# Patient Record
Sex: Female | Born: 2000 | Race: White | Hispanic: No | Smoking: Former smoker
Health system: Southern US, Community
[De-identification: ages and names within clinical notes are randomized; demographics above are authoritative.]

## PROBLEM LIST (undated history)

## (undated) DIAGNOSIS — K219 Gastro-esophageal reflux disease without esophagitis: Secondary | ICD-10-CM

## (undated) DIAGNOSIS — F32A Depression, unspecified: Secondary | ICD-10-CM

## (undated) DIAGNOSIS — F419 Anxiety disorder, unspecified: Secondary | ICD-10-CM

## (undated) HISTORY — PX: APPENDECTOMY: SHX54

## (undated) HISTORY — DX: Depression, unspecified: F32.A

## (undated) HISTORY — DX: Anxiety disorder, unspecified: F41.9

## (undated) HISTORY — DX: Gastro-esophageal reflux disease without esophagitis: K21.9

---
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
## 2018-01-16 ENCOUNTER — Other Ambulatory Visit: Payer: Self-pay | Admitting: Physician Assistant

## 2018-01-16 DIAGNOSIS — F39 Unspecified mood [affective] disorder: Secondary | ICD-10-CM

## 2018-01-16 DIAGNOSIS — F411 Generalized anxiety disorder: Secondary | ICD-10-CM

## 2018-01-16 DIAGNOSIS — J45909 Unspecified asthma, uncomplicated: Secondary | ICD-10-CM

## 2018-03-17 ENCOUNTER — Other Ambulatory Visit: Payer: Self-pay | Admitting: Family Medicine

## 2018-03-17 DIAGNOSIS — J101 Influenza due to other identified influenza virus with other respiratory manifestations: Secondary | ICD-10-CM

## 2018-12-22 ENCOUNTER — Telehealth: Payer: Self-pay | Admitting: Family

## 2018-12-22 NOTE — Telephone Encounter (Signed)
Pt is aware that she will have to come in and sign a medical records release form and the company [employer] comes in once a week to process, once processed pt will receive a statement once paid they will release records. Pt verbalized understanding states will come by and sign a release form.

## 2019-04-07 DIAGNOSIS — Z3689 Encounter for other specified antenatal screening: Secondary | ICD-10-CM | POA: Diagnosis not present

## 2019-05-07 DIAGNOSIS — Z3A22 22 weeks gestation of pregnancy: Secondary | ICD-10-CM | POA: Diagnosis not present

## 2019-05-07 DIAGNOSIS — Z3689 Encounter for other specified antenatal screening: Secondary | ICD-10-CM | POA: Diagnosis not present

## 2019-05-07 DIAGNOSIS — Z3492 Encounter for supervision of normal pregnancy, unspecified, second trimester: Secondary | ICD-10-CM | POA: Diagnosis not present

## 2019-05-07 DIAGNOSIS — N898 Other specified noninflammatory disorders of vagina: Secondary | ICD-10-CM | POA: Diagnosis not present

## 2019-05-07 DIAGNOSIS — O26892 Other specified pregnancy related conditions, second trimester: Secondary | ICD-10-CM | POA: Diagnosis not present

## 2019-06-01 ENCOUNTER — Ambulatory Visit (INDEPENDENT_AMBULATORY_CARE_PROVIDER_SITE_OTHER): Payer: Medicaid Other | Admitting: Otolaryngology

## 2019-06-01 ENCOUNTER — Other Ambulatory Visit: Payer: Self-pay

## 2019-06-01 DIAGNOSIS — H93293 Other abnormal auditory perceptions, bilateral: Secondary | ICD-10-CM

## 2019-06-02 ENCOUNTER — Ambulatory Visit (INDEPENDENT_AMBULATORY_CARE_PROVIDER_SITE_OTHER): Payer: Medicaid Other | Admitting: Family Medicine

## 2019-06-02 ENCOUNTER — Encounter: Payer: Self-pay | Admitting: Family Medicine

## 2019-06-02 DIAGNOSIS — R6889 Other general symptoms and signs: Secondary | ICD-10-CM

## 2019-06-02 DIAGNOSIS — Z20822 Contact with and (suspected) exposure to covid-19: Secondary | ICD-10-CM

## 2019-06-02 DIAGNOSIS — J069 Acute upper respiratory infection, unspecified: Secondary | ICD-10-CM | POA: Diagnosis not present

## 2019-06-02 NOTE — Patient Instructions (Signed)
Safe Medications in Pregnancy    Acne: Benzoyl Peroxide Salicylic Acid   Backache/Headache: Tylenol: 2 regular strength every 4 hours OR              2 Extra strength every 6 hours   Colds/Coughs/Allergies: Benadryl (alcohol free) 25 mg every 6 hours as needed Breath right strips Claritin Cepacol throat lozenges Chloraseptic throat spray Cold-Eeze- up to three times per day Cough drops, alcohol free Flonase (by prescription only) Guaifenesin Mucinex Robitussin DM (plain only, alcohol free) Saline nasal spray/drops Sudafed (pseudoephedrine) & Actifed ** use only after [redacted] weeks gestation and if you do not have high blood pressure Tylenol Vicks Vaporub Zinc lozenges Zyrtec    Constipation: Colace Ducolax suppositories Fleet enema Glycerin suppositories Metamucil Milk of magnesia Miralax Senokot Smooth move tea   Diarrhea: Kaopectate Imodium A-D   *NO pepto Bismol   Hemorrhoids: Anusol Anusol HC Preparation H Tucks   Indigestion: Tums Maalox Mylanta Zantac  Pepcid Nexium 24HR   Insomnia: Benadryl (alcohol free) 25mg every 6 hours as needed Tylenol PM Unisom, no Gelcaps   Leg Cramps: Tums MagGel   Nausea/Vomiting:  Bonine Dramamine Emetrol Ginger extract Sea bands Meclizine  Nausea medication to take during pregnancy:  Unisom (doxylamine succinate 25 mg tablets) Take one tablet daily at bedtime. If symptoms are not adequately controlled, the dose can be increased to a maximum recommended dose of two tablets daily (1/2 tablet in the morning, 1/2 tablet mid-afternoon and one at bedtime). Vitamin B6 100mg tablets. Take one tablet twice a day (up to 200 mg per day).   Skin Rashes: Aveeno products Benadryl cream or 25mg every 6 hours as needed Calamine Lotion 1% cortisone cream   Yeast infection: Gyne-lotrimin 7 Monistat 7     **If taking multiple medications, please check labels to avoid duplicating the same active ingredients **take  medication as directed on the label ** Do not exceed 4000 mg of tylenol in 24 hours **Do not take medications that contain aspirin or ibuprofen 

## 2019-06-03 DIAGNOSIS — Z20828 Contact with and (suspected) exposure to other viral communicable diseases: Secondary | ICD-10-CM | POA: Diagnosis not present

## 2019-06-03 DIAGNOSIS — J029 Acute pharyngitis, unspecified: Secondary | ICD-10-CM | POA: Diagnosis not present

## 2019-06-03 NOTE — Progress Notes (Signed)
Virtual Visit via telephone Note Due to COVID-19 pandemic this visit was conducted virtually. This visit type was conducted due to national recommendations for restrictions regarding the COVID-19 Pandemic (e.g. social distancing, sheltering in place) in an effort to limit this patient's exposure and mitigate transmission in our community. All issues noted in this document were discussed and addressed.  A physical exam was not performed with this format.   I connected with Anniebelle A Coster on 06/03/19 at 1610 by telephone and verified that I am speaking with the correct person using two identifiers. Kailen A Gryder is currently located at home and family is currently with them during visit. The provider, Monia Pouch, FNP is located in their office at time of visit.  I discussed the limitations, risks, security and privacy concerns of performing an evaluation and management service by telephone and the availability of in person appointments. I also discussed with the patient that there may be a patient responsible charge related to this service. The patient expressed understanding and agreed to proceed.  Subjective:  Patient ID: Carolin Guernsey, female    DOB: November 10, 2000, 18 y.o.   MRN: [id number]  Chief Complaint:  URI   HPI: Latreece A Castleman is a 18 y.o. female presenting on 06/02/2019 for URI   Pt reports 2-3 days of cold chills, cough, congestion, myalgias, ear pressure, and sore throat. Pt denies measured fever. States she has been out to the store. No known sick contacts. No recent travel. She has not tried anything or her symptoms.   URI  This is a new problem. The current episode started in the past 7 days. The problem has been unchanged. There has been no fever. Associated symptoms include congestion, coughing, ear pain, a plugged ear sensation, rhinorrhea and a sore throat. Pertinent negatives include no abdominal pain, chest pain, diarrhea, dysuria, headaches, joint pain, joint swelling,  nausea, neck pain, rash, sinus pain, sneezing, swollen glands, vomiting or wheezing. She has tried nothing for the symptoms.     Relevant past medical, surgical, family, and social history reviewed and updated as indicated.  Allergies and medications reviewed and updated.   Past Medical History:  Diagnosis Date  . GERD (gastroesophageal reflux disease)     Past Surgical History:  Procedure Laterality Date  . APPENDECTOMY      Social History   Socioeconomic History  . Marital status: Single    Spouse name: Not on file  . Number of children: Not on file  . Years of education: Not on file  . Highest education level: Not on file  Occupational History  . Not on file  Social Needs  . Financial resource strain: Not on file  . Food insecurity    Worry: Not on file    Inability: Not on file  . Transportation needs    Medical: Not on file    Non-medical: Not on file  Tobacco Use  . Smoking status: Never Smoker  . Smokeless tobacco: Never Used  Substance and Sexual Activity  . Alcohol use: No  . Drug use: No  . Sexual activity: Yes  Lifestyle  . Physical activity    Days per week: Not on file    Minutes per session: Not on file  . Stress: Not on file  Relationships  . Social Herbalist on phone: Not on file    Gets together: Not on file    Attends religious service: Not on file    Active member  of club or organization: Not on file    Attends meetings of clubs or organizations: Not on file    Relationship status: Not on file  . Intimate partner violence    Fear of current or ex partner: Not on file    Emotionally abused: Not on file    Physically abused: Not on file    Forced sexual activity: Not on file  Other Topics Concern  . Not on file  Social History Narrative  . Not on file    Outpatient Encounter Medications as of 06/02/2019  Medication Sig  . albuterol (PROAIR HFA) 108 (90 Base) MCG/ACT inhaler USE 2 PUFFS EVERY 6 HOURS AS NEEDED  .  fluticasone (FLONASE) 50 MCG/ACT nasal spray Place 2 sprays into both nostrils daily.  Marland Kitchen. levonorgestrel-ethinyl estradiol (ENPRESSE,TRIVORA) tablet Take 1 tablet by mouth daily.  Marland Kitchen. loratadine (CLARITIN) 10 MG tablet Take 1 tablet (10 mg total) by mouth daily.  Marland Kitchen. omeprazole (PRILOSEC) 20 MG capsule Take 1 capsule (20 mg total) by mouth daily.  Marland Kitchen. oseltamivir (TAMIFLU) 75 MG capsule Take 1 capsule (75 mg total) by mouth 2 (two) times daily.  Marland Kitchen. oxybutynin (DITROPAN-XL) 10 MG 24 hr tablet Take 1 tablet (10 mg total) by mouth at bedtime.  . risperiDONE (RISPERDAL) 2 MG tablet TAKE ONE (1) TABLET EACH DAY (Patient not taking: Reported on 12/30/2017)  . sertraline (ZOLOFT) 100 MG tablet Take 2 tablets (200 mg total) by mouth daily.  . sertraline (ZOLOFT) 100 MG tablet TAKE TWO TABLETS BY MOUTH DAILY  . sulfamethoxazole-trimethoprim (BACTRIM DS) 800-160 MG tablet Take 1 tablet by mouth 2 (two) times daily.  . SUMAtriptan (IMITREX) 50 MG tablet Take 1 tablet (50 mg total) by mouth every 2 (two) hours as needed for migraine. May repeat in 2 hours if headache persists or recurs.   No facility-administered encounter medications on file as of 06/02/2019.     Allergies  Allergen Reactions  . Penicillins     Review of Systems  Constitutional: Positive for chills and fatigue. Negative for activity change, appetite change, diaphoresis, fever and unexpected weight change.  HENT: Positive for congestion, ear pain, postnasal drip, rhinorrhea and sore throat. Negative for dental problem, drooling, ear discharge, facial swelling, hearing loss, mouth sores, nosebleeds, sinus pressure, sinus pain, sneezing, tinnitus, trouble swallowing and voice change.   Respiratory: Positive for cough. Negative for shortness of breath and wheezing.   Cardiovascular: Negative for chest pain and palpitations.  Gastrointestinal: Negative for abdominal pain, diarrhea, nausea and vomiting.  Genitourinary: Negative for dysuria.   Musculoskeletal: Negative for arthralgias, joint pain and neck pain.  Skin: Negative for rash.  Neurological: Negative for dizziness, weakness, light-headedness and headaches.  Psychiatric/Behavioral: Negative for confusion.  All other systems reviewed and are negative.        Observations/Objective: No vital signs or physical exam, this was a telephone or virtual health encounter.  Pt alert and oriented, answers all questions appropriately, and able to speak in full sentences.    Assessment and Plan: Laurabeth was seen today for uri.  Diagnoses and all orders for this visit:  URI with cough and congestion Suspected 2019 Novel Coronavirus Infection Reported signs and symptoms concerning for COVID-19 infection. Testing ordered and pt aware of testing sites and times of operation. Symptomatic care discussed. Self quarantine and infection prevention measures discussed with pt. Pt aware to remain in quarantine for at least 72 hours after symptoms free without medications. Pt aware to report any new or worsening symptoms.  -  Novel Coronavirus, NAA ([facility]); Future     Follow Up Instructions: Return if symptoms worsen or fail to improve.    I discussed the assessment and treatment plan with the patient. The patient was provided an opportunity to ask questions and all were answered. The patient agreed with the plan and demonstrated an understanding of the instructions.   The patient was advised to call back or seek an in-person evaluation if the symptoms worsen or if the condition fails to improve as anticipated.  The above assessment and management plan was discussed with the patient. The patient verbalized understanding of and has agreed to the management plan. Patient is aware to call the clinic if symptoms persist or worsen. Patient is aware when to return to the clinic for a follow-up visit. Patient educated on when it is appropriate to go to the emergency department.    I  provided 15 minutes of non-face-to-face time during this encounter. The call started at 1610. The call ended at 1625. The other time was used for coordination of care.    Kari BaarsMichelle Rakes, FNP-C [facility] [address] [place], [place] [address] [phone]

## 2019-06-11 DIAGNOSIS — O9989 Other specified diseases and conditions complicating pregnancy, childbirth and the puerperium: Secondary | ICD-10-CM | POA: Diagnosis not present

## 2019-06-11 DIAGNOSIS — Z3689 Encounter for other specified antenatal screening: Secondary | ICD-10-CM | POA: Diagnosis not present

## 2019-06-11 DIAGNOSIS — M549 Dorsalgia, unspecified: Secondary | ICD-10-CM | POA: Diagnosis not present

## 2019-06-25 DIAGNOSIS — Z3689 Encounter for other specified antenatal screening: Secondary | ICD-10-CM | POA: Diagnosis not present

## 2019-07-20 DIAGNOSIS — Z3689 Encounter for other specified antenatal screening: Secondary | ICD-10-CM | POA: Diagnosis not present

## 2019-07-23 DIAGNOSIS — Z880 Allergy status to penicillin: Secondary | ICD-10-CM | POA: Diagnosis not present

## 2019-07-23 DIAGNOSIS — R58 Hemorrhage, not elsewhere classified: Secondary | ICD-10-CM | POA: Diagnosis not present

## 2019-07-23 DIAGNOSIS — O42919 Preterm premature rupture of membranes, unspecified as to length of time between rupture and onset of labor, unspecified trimester: Secondary | ICD-10-CM

## 2019-08-14 DIAGNOSIS — R197 Diarrhea, unspecified: Secondary | ICD-10-CM | POA: Diagnosis not present

## 2019-08-14 DIAGNOSIS — Z20828 Contact with and (suspected) exposure to other viral communicable diseases: Secondary | ICD-10-CM | POA: Diagnosis not present

## 2019-08-14 DIAGNOSIS — Z881 Allergy status to other antibiotic agents status: Secondary | ICD-10-CM | POA: Diagnosis not present

## 2019-08-14 DIAGNOSIS — Z1159 Encounter for screening for other viral diseases: Secondary | ICD-10-CM | POA: Diagnosis not present

## 2019-08-14 DIAGNOSIS — R519 Headache, unspecified: Secondary | ICD-10-CM | POA: Diagnosis not present

## 2019-08-14 DIAGNOSIS — U071 COVID-19: Secondary | ICD-10-CM | POA: Diagnosis not present

## 2019-08-14 DIAGNOSIS — Z880 Allergy status to penicillin: Secondary | ICD-10-CM | POA: Diagnosis not present

## 2019-08-14 DIAGNOSIS — R6883 Chills (without fever): Secondary | ICD-10-CM | POA: Diagnosis not present

## 2019-08-14 DIAGNOSIS — O9089 Other complications of the puerperium, not elsewhere classified: Secondary | ICD-10-CM | POA: Diagnosis not present

## 2019-08-18 DIAGNOSIS — R457 State of emotional shock and stress, unspecified: Secondary | ICD-10-CM | POA: Diagnosis not present

## 2019-08-18 DIAGNOSIS — R4020 Unspecified coma: Secondary | ICD-10-CM | POA: Diagnosis not present

## 2019-08-22 DIAGNOSIS — H40033 Anatomical narrow angle, bilateral: Secondary | ICD-10-CM | POA: Diagnosis not present

## 2019-08-22 DIAGNOSIS — H16223 Keratoconjunctivitis sicca, not specified as Sjogren's, bilateral: Secondary | ICD-10-CM | POA: Diagnosis not present

## 2019-10-01 DIAGNOSIS — H5213 Myopia, bilateral: Secondary | ICD-10-CM | POA: Diagnosis not present

## 2019-10-15 DIAGNOSIS — Z3042 Encounter for surveillance of injectable contraceptive: Secondary | ICD-10-CM | POA: Diagnosis not present

## 2019-11-03 DIAGNOSIS — O99343 Other mental disorders complicating pregnancy, third trimester: Secondary | ICD-10-CM | POA: Diagnosis not present

## 2019-11-03 DIAGNOSIS — F329 Major depressive disorder, single episode, unspecified: Secondary | ICD-10-CM | POA: Diagnosis not present

## 2019-12-17 DIAGNOSIS — T148XXA Other injury of unspecified body region, initial encounter: Secondary | ICD-10-CM | POA: Diagnosis not present

## 2019-12-17 DIAGNOSIS — N939 Abnormal uterine and vaginal bleeding, unspecified: Secondary | ICD-10-CM | POA: Diagnosis not present

## 2019-12-17 DIAGNOSIS — F321 Major depressive disorder, single episode, moderate: Secondary | ICD-10-CM | POA: Diagnosis not present

## 2020-01-12 DIAGNOSIS — Z3042 Encounter for surveillance of injectable contraceptive: Secondary | ICD-10-CM | POA: Diagnosis not present

## 2020-05-05 DIAGNOSIS — Z419 Encounter for procedure for purposes other than remedying health state, unspecified: Secondary | ICD-10-CM | POA: Diagnosis not present

## 2020-05-24 ENCOUNTER — Ambulatory Visit: Payer: Medicaid Other | Admitting: Nurse Practitioner

## 2020-06-02 ENCOUNTER — Ambulatory Visit: Payer: Self-pay | Admitting: Nurse Practitioner

## 2020-06-05 DIAGNOSIS — Z419 Encounter for procedure for purposes other than remedying health state, unspecified: Secondary | ICD-10-CM | POA: Diagnosis not present

## 2020-06-06 ENCOUNTER — Encounter: Payer: Self-pay | Admitting: Physician Assistant

## 2020-06-06 ENCOUNTER — Ambulatory Visit: Payer: Self-pay | Admitting: Nurse Practitioner

## 2020-07-13 ENCOUNTER — Other Ambulatory Visit: Payer: Self-pay

## 2020-07-13 ENCOUNTER — Ambulatory Visit (INDEPENDENT_AMBULATORY_CARE_PROVIDER_SITE_OTHER): Payer: Medicaid Other | Admitting: Family Medicine

## 2020-07-13 ENCOUNTER — Encounter: Payer: Self-pay | Admitting: Family Medicine

## 2020-07-13 VITALS — BP 114/76 | HR 78 | Temp 98.2°F | Ht 62.0 in | Wt 160.0 lb

## 2020-07-13 DIAGNOSIS — Z30011 Encounter for initial prescription of contraceptive pills: Secondary | ICD-10-CM | POA: Diagnosis not present

## 2020-07-13 DIAGNOSIS — F339 Major depressive disorder, recurrent, unspecified: Secondary | ICD-10-CM | POA: Diagnosis not present

## 2020-07-13 DIAGNOSIS — G2581 Restless legs syndrome: Secondary | ICD-10-CM

## 2020-07-13 DIAGNOSIS — G43809 Other migraine, not intractable, without status migrainosus: Secondary | ICD-10-CM | POA: Diagnosis not present

## 2020-07-13 DIAGNOSIS — J4520 Mild intermittent asthma, uncomplicated: Secondary | ICD-10-CM | POA: Diagnosis not present

## 2020-07-13 DIAGNOSIS — F411 Generalized anxiety disorder: Secondary | ICD-10-CM

## 2020-07-13 MED ORDER — SUMATRIPTAN SUCCINATE 50 MG PO TABS: 50.0000 mg | ORAL_TABLET | ORAL | 1 refills | Status: DC | PRN

## 2020-07-13 NOTE — Patient Instructions (Signed)
Decrease Lamictal to 50 mg twice daily x1 week, Then decrease to 25 mg twice daily x1 week, Then stop.  Come back for urine pregnancy and prescription of oral birth control once you are off the Lamictal.

## 2020-07-16 MED ORDER — SERTRALINE HCL 50 MG PO TABS: 50.0000 mg | ORAL_TABLET | Freq: Every day | ORAL | 2 refills | Status: DC

## 2020-07-16 MED ORDER — ALBUTEROL SULFATE HFA 108 (90 BASE) MCG/ACT IN AERS: 2.0000 | INHALATION_SPRAY | Freq: Four times a day (QID) | RESPIRATORY_TRACT | 2 refills | Status: DC | PRN

## 2020-07-16 NOTE — Progress Notes (Signed)
Assessment & Plan:  1. Mild intermittent asthma without complication - Rx'd Albuterol inhaler for as needed use as patient does not have one. - albuterol (VENTOLIN HFA) 108 (90 Base) MCG/ACT inhaler; Inhale 2 puffs into the lungs every 6 (six) hours as needed.  Dispense: 18 g; Refill: 2  2-3. Generalized anxiety disorder/Depression, recurrent (HCC) - Uncontrolled. Started patient on sertraline since it worked well in the past. Weaning off Lamictal since it is not helping so that she can start oral contraceptives. Advised to decrease Lamictal from 100 mg BID to 50 mg BID x1 week, then decrease to 25 mg BID x1 week, then stop. She understands if at any point she starts having side effects, she needs to go back to the prior dosage and we need to go slower.  - Ambulatory referral to Psychiatry - CMP14+EGFR; Future - Anemia Profile B; Future - TSH; Future - sertraline (ZOLOFT) 50 MG tablet; Take 1 tablet (50 mg total) by mouth daily.  Dispense: 30 tablet; Refill: 2  4. Restless legs - Labs to see if there is a cause there. - CMP14+EGFR; Future - Anemia Profile B; Future - TSH; Future  5. Other migraine without status migrainosus, not intractable - Well controlled on current regimen.  - SUMAtriptan (IMITREX) 50 MG tablet; Take 1 tablet (50 mg total) by mouth every 2 (two) hours as needed for migraine. May repeat in 2 hours if headache persists or recurs.  Dispense: 10 tablet; Refill: 1  6. Encounter for oral contraception initial prescription - Patient will return for urine pregnancy test after she is off Lamictal so that oral contraceptives can be prescribed.  - Pregnancy, urine; Future   Return in about 6 weeks (around 08/24/2020) for mood.  Hendricks Limes, MSN, APRN, FNP-C [facility]  Subjective:    Patient ID: Cheryl Frank, female    DOB: October 05, 2001, 19 y.o.   MRN: [id number]  Patient Care Team: Loman Brooklyn, FNP as PCP - General (Family Medicine)    Chief Complaint:  Chief Complaint  Patient presents with  . Establish Care    Jones pt   . Depression    Check up of chronic medical conditions.  Patient states that she has been off her meds for x 3 years but they worked when she had them before.  . Anxiety    HPI: Cheryl Frank is a 19 y.o. female presenting on 07/13/2020 for Establish Care Ronnald Ramp pt ), Depression (Check up of chronic medical conditions.  Patient states that she has been off her meds for x 3 years but they worked when she had them before.), and Anxiety  Patient has a history of asthma and does not have a prescription for an albuterol inhaler.  Patient reports she is struggling with her depression and anxiety.  Most recently she was prescribed Lamictal and Prozac by her OB/GYN.  She is not taking the Prozac and does not feel the Lamictal has been working for her.  Patient reports a lot of her issues stem from the birth of her second son.  She reports he was born at home.  She reports having nightmares about all the blood frequently.  She took sertraline when she was younger, which worked well for her, but this was 3 years ago when she stopped it.  Depression screen [facility] 2/9 07/13/2020 12/30/2017 11/20/2017  Decreased Interest 1 0 0  Down, Depressed, Hopeless 2 0 0  PHQ - 2 Score 3 0 0  Altered sleeping 1 - -  Tired, decreased energy 2 - -  Change in appetite 2 - -  Feeling bad or failure about yourself  1 - -  Trouble concentrating 3 - -  Moving slowly or fidgety/restless 1 - -  Suicidal thoughts 0 - -  PHQ-9 Score 13 - -   GAD 7 : Generalized Anxiety Score 07/13/2020  Nervous, Anxious, on Edge 3  Control/stop worrying 3  Worry too much - different things 3  Trouble relaxing 3  Restless 3  Easily annoyed or irritable 3  Afraid - awful might happen 2  Total GAD 7 Score 20    Patient would also like oral birth control pills.  She has been getting the Depo shot from her OB/GYN but does not wish to continue this due  to weight gain.  She has been told by her OB/GYN she cannot take any birth control with estrogen in it as long as she is taking the Lamictal.  Patient states she has restless leg syndrome.   Social history:  Relevant past medical, surgical, family and social history reviewed and updated as indicated. Interim medical history since our last visit reviewed.  Allergies and medications reviewed and updated.  DATA REVIEWED: CHART IN EPIC  ROS: Negative unless specifically indicated above in HPI.    Current Outpatient Medications:  .  SUMAtriptan (IMITREX) 50 MG tablet, Take 1 tablet (50 mg total) by mouth every 2 (two) hours as needed for migraine. May repeat in 2 hours if headache persists or recurs., Disp: 10 tablet, Rfl: 1   Allergies  Allergen Reactions  . Penicillins    Past Medical History:  Diagnosis Date  . GERD (gastroesophageal reflux disease)     Past Surgical History:  Procedure Laterality Date  . APPENDECTOMY      Social History   Socioeconomic History  . Marital status: Single    Spouse name: Not on file  . Number of children: Not on file  . Years of education: Not on file  . Highest education level: Not on file  Occupational History  . Not on file  Tobacco Use  . Smoking status: Former Smoker    Types: Cigarettes    Quit date: 2019    Years since quitting: 2.6  . Smokeless tobacco: Never Used  Vaping Use  . Vaping Use: Never used  Substance and Sexual Activity  . Alcohol use: No  . Drug use: No  . Sexual activity: Yes    Birth control/protection: Pill  Other Topics Concern  . Not on file  Social History Narrative  . Not on file   Social Determinants of Health   Financial Resource Strain:   . Difficulty of Paying Living Expenses: Not on file  Food Insecurity:   . Worried About Charity fundraiser in the Last Year: Not on file  . Ran Out of Food in the Last Year: Not on file  Transportation Needs:   . Lack of Transportation (Medical): Not on  file  . Lack of Transportation (Non-Medical): Not on file  Physical Activity:   . Days of Exercise per Week: Not on file  . Minutes of Exercise per Session: Not on file  Stress:   . Feeling of Stress : Not on file  Social Connections:   . Frequency of Communication with Friends and Family: Not on file  . Frequency of Social Gatherings with Friends and Family: Not on file  . Attends Religious Services: Not on file  .  Active Member of Clubs or Organizations: Not on file  . Attends Archivist Meetings: Not on file  . Marital Status: Not on file  Intimate Partner Violence:   . Fear of Current or Ex-Partner: Not on file  . Emotionally Abused: Not on file  . Physically Abused: Not on file  . Sexually Abused: Not on file        Objective:    BP 114/76   Pulse 78   Temp 98.2 F (36.8 C) (Temporal)   Ht 5' 2"  (1.575 m)   Wt 160 lb (72.6 kg)   LMP 06/22/2020 (Approximate)   SpO2 97%   BMI 29.26 kg/m   Wt Readings from Last 3 Encounters:  07/13/20 160 lb (72.6 kg)  12/30/17 138 lb (62.6 kg) (70 %, Z= 0.53)*  11/20/17 143 lb 3.2 oz (65 kg) (77 %, Z= 0.73)*   * Growth percentiles are based on CDC (Girls, 2-20 Years) data.    Physical Exam Vitals reviewed.  Constitutional:      General: She is not in acute distress.    Appearance: Normal appearance. She is overweight. She is not ill-appearing, toxic-appearing or diaphoretic.  HENT:     Head: Normocephalic and atraumatic.  Eyes:     General: No scleral icterus.       Right eye: No discharge.        Left eye: No discharge.     Conjunctiva/sclera: Conjunctivae normal.  Cardiovascular:     Rate and Rhythm: Normal rate and regular rhythm.     Heart sounds: Normal heart sounds. No murmur heard.  No friction rub. No gallop.   Pulmonary:     Effort: Pulmonary effort is normal. No respiratory distress.     Breath sounds: Normal breath sounds. No stridor. No wheezing, rhonchi or rales.  Musculoskeletal:         General: Normal range of motion.     Cervical back: Normal range of motion.  Skin:    General: Skin is warm and dry.     Capillary Refill: Capillary refill takes less than 2 seconds.  Neurological:     General: No focal deficit present.     Mental Status: She is alert and oriented to person, place, and time. Mental status is at baseline.  Psychiatric:        Mood and Affect: Mood normal.        Behavior: Behavior normal.        Thought Content: Thought content normal.        Judgment: Judgment normal.      No results found for: TSH Lab Results  Component Value Date   WBC 9.4 08/07/2015   HGB 13.3 08/07/2015   HCT 40.9 08/07/2015   MCV 87.0 08/07/2015   PLT 244 08/07/2015   Lab Results  Component Value Date   NA 140 08/07/2015   K 3.9 08/07/2015   CO2 27 08/07/2015   GLUCOSE 90 08/07/2015   BUN 8 08/07/2015   CREATININE 0.84 08/07/2015   CALCIUM 8.8 (L) 08/07/2015   ANIONGAP 6 08/07/2015   No results found for: CHOL No results found for: HDL No results found for: LDLCALC No results found for: TRIG No results found for: CHOLHDL No results found for: HGBA1C

## 2020-08-05 DIAGNOSIS — Z419 Encounter for procedure for purposes other than remedying health state, unspecified: Secondary | ICD-10-CM | POA: Diagnosis not present

## 2020-08-24 ENCOUNTER — Ambulatory Visit: Payer: Medicaid Other | Admitting: Family Medicine

## 2020-08-31 ENCOUNTER — Other Ambulatory Visit: Payer: Self-pay | Admitting: Family Medicine

## 2020-08-31 DIAGNOSIS — G43809 Other migraine, not intractable, without status migrainosus: Secondary | ICD-10-CM

## 2020-09-02 ENCOUNTER — Ambulatory Visit: Payer: Medicaid Other | Admitting: Nurse Practitioner

## 2020-09-05 DIAGNOSIS — Z419 Encounter for procedure for purposes other than remedying health state, unspecified: Secondary | ICD-10-CM | POA: Diagnosis not present

## 2020-09-06 ENCOUNTER — Encounter: Payer: Self-pay | Admitting: Family Medicine

## 2020-10-05 DIAGNOSIS — Z419 Encounter for procedure for purposes other than remedying health state, unspecified: Secondary | ICD-10-CM | POA: Diagnosis not present

## 2020-10-31 ENCOUNTER — Other Ambulatory Visit: Payer: Self-pay

## 2020-10-31 ENCOUNTER — Encounter: Payer: Self-pay | Admitting: Family Medicine

## 2020-10-31 ENCOUNTER — Ambulatory Visit (INDEPENDENT_AMBULATORY_CARE_PROVIDER_SITE_OTHER): Payer: Medicaid Other | Admitting: Family Medicine

## 2020-10-31 VITALS — BP 91/58 | HR 98 | Temp 98.3°F | Ht 62.0 in | Wt 149.0 lb

## 2020-10-31 DIAGNOSIS — N926 Irregular menstruation, unspecified: Secondary | ICD-10-CM | POA: Diagnosis not present

## 2020-10-31 DIAGNOSIS — Z3201 Encounter for pregnancy test, result positive: Secondary | ICD-10-CM

## 2020-10-31 DIAGNOSIS — J4530 Mild persistent asthma, uncomplicated: Secondary | ICD-10-CM | POA: Diagnosis not present

## 2020-10-31 DIAGNOSIS — F411 Generalized anxiety disorder: Secondary | ICD-10-CM | POA: Diagnosis not present

## 2020-10-31 DIAGNOSIS — F339 Major depressive disorder, recurrent, unspecified: Secondary | ICD-10-CM | POA: Diagnosis not present

## 2020-10-31 LAB — PREGNANCY, URINE: Preg Test, Ur: POSITIVE — AB

## 2020-10-31 MED ORDER — FLOVENT DISKUS 50 MCG/BLIST IN AEPB: 1.0000 | INHALATION_SPRAY | Freq: Two times a day (BID) | RESPIRATORY_TRACT | 12 refills | Status: DC

## 2020-10-31 MED ORDER — ALBUTEROL SULFATE HFA 108 (90 BASE) MCG/ACT IN AERS: 2.0000 | INHALATION_SPRAY | Freq: Four times a day (QID) | RESPIRATORY_TRACT | 2 refills | Status: DC | PRN

## 2020-10-31 MED ORDER — FLUOXETINE HCL 20 MG PO TABS: 20.0000 mg | ORAL_TABLET | Freq: Every day | ORAL | 3 refills | Status: DC

## 2020-10-31 NOTE — Progress Notes (Signed)
Subjective: CC: anxiety, depression PCP: Gwenlyn Fudge, FNP  [id number] Cheryl Frank is Cheryl 19 y.o. female presenting to clinic today for:  1. Irregular cycle Cheryl Frank is unsure of her last cycle. Her app on her phone says that she is 5 days late but it does not have her LMP. She has been feeling fatigued and Cheryl little nauseous. She was taking Depo shot but was unable to get to the office for her last one, so she has not had one since March.  She has 2 small children at home. This is an unplanned pregnancy.   2. Anxiety/Depression Cheryl Frank was taking Zoloft in September but didn't feel an improvement at all. So she stopped taking it. She was previously on Prozac and Lamictal by her OB and felt like her anxiety and depression was controlled then.   Depression screen [facility] 2/9 10/31/2020 07/13/2020 12/30/2017  Decreased Interest 1 1 0  Down, Depressed, Hopeless 1 2 0  PHQ - 2 Score 2 3 0  Altered sleeping 0 1 -  Tired, decreased energy 1 2 -  Change in appetite 1 2 -  Feeling bad or failure about yourself  1 1 -  Trouble concentrating 1 3 -  Moving slowly or fidgety/restless 1 1 -  Suicidal thoughts 0 0 -  PHQ-9 Score 7 13 -  Difficult doing work/chores Somewhat difficult - -   GAD 7 : Generalized Anxiety Score 10/31/2020 07/13/2020  Nervous, Anxious, on Edge 1 3  Control/stop worrying 1 3  Worry too much - different things 1 3  Trouble relaxing 1 3  Restless 1 3  Easily annoyed or irritable 1 3  Afraid - awful might happen 1 2  Total GAD 7 Score 7 20  Anxiety Difficulty Somewhat difficult -   3. Asthma Cheryl Frank reports using her albuterol inhaler 1-2x Cheryl day everyday. She has never been on Cheryl controller inhaler. She reports waking up at night with shortness of breath as well. Denies cough or fever.   Relevant past medical, surgical, family, and social history reviewed and updated as indicated.  Allergies and medications reviewed and updated.  Allergies  Allergen Reactions  .  Penicillins    Past Medical History:  Diagnosis Date  . GERD (gastroesophageal reflux disease)     Current Outpatient Medications:  .  albuterol (VENTOLIN HFA) 108 (90 Base) MCG/ACT inhaler, Inhale 2 puffs into the lungs every 6 (six) hours as needed., Disp: 18 g, Rfl: 2 Social History   Socioeconomic History  . Marital status: Single    Spouse name: Not on file  . Number of children: Not on file  . Years of education: Not on file  . Highest education level: Not on file  Occupational History  . Not on file  Tobacco Use  . Smoking status: Former Smoker    Types: Cigarettes    Quit date: 2019    Years since quitting: 2.9  . Smokeless tobacco: Never Used  Vaping Use  . Vaping Use: Never used  Substance and Sexual Activity  . Alcohol use: No  . Drug use: No  . Sexual activity: Yes    Birth control/protection: Pill  Other Topics Concern  . Not on file  Social History Narrative  . Not on file   Social Determinants of Health   Financial Resource Strain: Not on file  Food Insecurity: Not on file  Transportation Needs: Not on file  Physical Activity: Not on file  Stress: Not on file  Social Connections: Not on file  Intimate Partner Violence: Not on file   No family history on file.  Review of Systems  Negative unless specially indicated above in HPI.  Objective: Office vital signs reviewed. BP (!) 91/58   Pulse 98   Temp 98.3 F (36.8 C) (Temporal)   Ht 5\' 2"  (1.575 m)   Wt 149 lb (67.6 kg)   BMI 27.25 kg/m   Physical Examination:  Physical Exam Vitals and nursing note reviewed.  Constitutional:      General: She is not in acute distress.    Appearance: Normal appearance. She is not ill-appearing, toxic-appearing or diaphoretic.  Cardiovascular:     Rate and Rhythm: Normal rate and regular rhythm.     Heart sounds: Normal heart sounds. No murmur heard.   Pulmonary:     Effort: Pulmonary effort is normal. No respiratory distress.     Breath sounds:  Normal breath sounds. No wheezing.  Musculoskeletal:     Right lower leg: No edema.     Left lower leg: No edema.  Skin:    General: Skin is warm and dry.     Capillary Refill: Capillary refill takes less than 2 seconds.  Neurological:     General: No focal deficit present.     Mental Status: She is alert and oriented to person, place, and time.  Psychiatric:        Mood and Affect: Mood normal.        Behavior: Behavior normal.     Assessment/ Plan: Cheryl Frank was seen today for menstrual problem.  Diagnoses and all orders for this visit:  Depression, recurrent (HCC)/Generalized anxiety disorder Start prozac since she did well on this previous. Will defer Lamictal to her OB since she is currently pregnant was was previously prescribed this by her OB. New referral to psych placed.  -     FLUoxetine (PROZAC) 20 MG tablet; Take 1 tablet (20 mg total) by mouth daily. -     Ambulatory referral to Psychiatry  Abnormal menses Positive urine pregnancy test. Patient is established at [facility] and will call to make an appointment.  -     Pregnancy, urine  Positive urine pregnancy test Pending as patient is unsure of LMP.  -     Beta hCG quant (ref lab)  Mild persistent asthma without complication Refill albuterol. Add Flovent for daily control.  -     albuterol (VENTOLIN HFA) 108 (90 Base) MCG/ACT inhaler; Inhale 2 puffs into the lungs every 6 (six) hours as needed. -     fluticasone (FLOVENT DISKUS) 50 MCG/BLIST diskus inhaler; Inhale 1 puff into the lungs 2 (two) times daily.  Follow up in 6 months with PCP, sooner if needed.   The above assessment and management plan was discussed with the patient. The patient verbalized understanding of and has agreed to the management plan. Patient is aware to call the clinic if symptoms persist or worsen. Patient is aware when to return to the clinic for Cheryl follow-up visit. Patient educated on when it is appropriate to go to the  emergency department.   [place], FNP-C [facility] [address] [place], [place] [phone]

## 2020-10-31 NOTE — Patient Instructions (Signed)
Perinatal Depression When a woman feels excessive sadness, anger, or anxiety during pregnancy or during the first 12 months after she gives birth, she has a condition called perinatal depression. Depression can interfere with work, school, relationships, and other everyday activities. If it is not managed properly, it can also cause problems in the mother and her baby. Sometimes, perinatal depression is left untreated because symptoms are thought to be normal mood swings during and right after pregnancy. If you have symptoms of depression, it is important to talk with your health care provider. What are the causes? The exact cause of this condition is not known. Hormonal changes during and after pregnancy may play a role in causing perinatal depression. What increases the risk? You are more likely to develop this condition if:  You have a personal or family history of depression, anxiety, or mood disorders.  You experience a stressful life event during pregnancy, such as the death of a loved one.  You have a lot of regular life stress.  You do not have support from family members or loved ones, or you are in an abusive relationship. What are the signs or symptoms? Symptoms of this condition include:  Feeling sad or hopeless.  Feelings of guilt.  Feeling irritable or overwhelmed.  Changes in your appetite.  Lack of energy or motivation.  Sleep problems.  Difficulty concentrating or completing tasks.  Loss of interest in hobbies or relationships.  Headaches or stomach problems that do not go away. How is this diagnosed? This condition is diagnosed based on a physical exam and mental evaluation. In some cases, your health care provider may use a depression screening tool. These tools include a list of questions that can help a health care provider diagnose depression. Your health care provider may refer you to a mental health expert who specializes in depression. How is this  treated? This condition may be treated with:  Medicines. Your health care provider will only give you medicines that have been proven safe for pregnancy and breastfeeding.  Talk therapy with a mental health professional to help change your patterns of thinking (cognitive behavioral therapy).  Support groups.  Brain stimulation or light therapies.  Stress reduction therapies, such as mindfulness. Follow these instructions at home: Lifestyle  Do not use any products that contain nicotine or tobacco, such as cigarettes and e-cigarettes. If you need help quitting, ask your health care provider.  Do not use alcohol when you are pregnant. After your baby is born, limit alcohol intake to no more than 1 drink a day. One drink equals 12 oz of beer, 5 oz of wine, or 1 oz of hard liquor.  Consider joining a support group for new mothers. Ask your health care provider for recommendations.  Take good care of yourself. Make sure you: ? Get plenty of sleep. If you are having trouble sleeping, talk with your health care provider. ? Eat a healthy diet. This includes plenty of fruits and vegetables, whole grains, and lean proteins. ? Exercise regularly, as told by your health care provider. Ask your health care provider what exercises are safe for you. General instructions  Take over-the-counter and prescription medicines only as told by your health care provider.  Talk with your partner or family members about your feelings during pregnancy. Share any concerns or anxieties that you may have.  Ask for help with tasks or chores when you need it. Ask friends and family members to provide meals, watch your children, or help with   cleaning.  Keep all follow-up visits as told by your health care provider. This is important. Contact a health care provider if:  You (or people close to you) notice that you have any symptoms of depression.  You have depression and your symptoms get worse.  You  experience side effects from medicines, such as nausea or sleep problems. Get help right away if:  You feel like hurting yourself, your baby, or someone else. If you ever feel like you may hurt yourself or others, or have thoughts about taking your own life, get help right away. You can go to your nearest emergency department or call:  Your local emergency services (911 in the [place]  A suicide crisis helpline, such as the National Suicide Prevention Lifeline at 7180326625. This is open 24 hours a day. Summary  Perinatal depression is when a woman feels excessive sadness, anger, or anxiety during pregnancy or during the first 12 months after she gives birth.  If perinatal depression is not treated, it can lead to health problems for the mother and her baby.  This condition is treated with medicines, talk therapy, stress reduction therapies, or a combination of two or more treatments.  Talk with your partner or family members about your feelings. Do not be afraid to ask for help. This information is not intended to replace advice given to you by your health care provider. Make sure you discuss any questions you have with your health care provider. Document Revised: 04/08/2019 Document Reviewed: 12/19/2016 Elsevier Patient Education  2020 [employer] Generalized Anxiety Disorder, Adult Generalized anxiety disorder (GAD) is a mental health disorder. People with this condition constantly worry about everyday events. Unlike normal anxiety, worry related to GAD is not triggered by a specific event. These worries also do not fade or get better with time. GAD interferes with life functions, including relationships, work, and school. GAD can vary from mild to severe. People with severe GAD can have intense waves of anxiety with physical symptoms (panic attacks). What are the causes? The exact cause of GAD is not known. What increases the risk? This condition is more likely to develop  in:  Women.  People who have a family history of anxiety disorders.  People who are very shy.  People who experience very stressful life events, such as the death of a loved one.  People who have a very stressful family environment. What are the signs or symptoms? People with GAD often worry excessively about many things in their lives, such as their health and family. They may also be overly concerned about:  Doing well at work.  Being on time.  Natural disasters.  Friendships. Physical symptoms of GAD include:  Fatigue.  Muscle tension or having muscle twitches.  Trembling or feeling shaky.  Being easily startled.  Feeling like your heart is pounding or racing.  Feeling out of breath or like you cannot take a deep breath.  Having trouble falling asleep or staying asleep.  Sweating.  Nausea, diarrhea, or irritable bowel syndrome (IBS).  Headaches.  Trouble concentrating or remembering facts.  Restlessness.  Irritability. How is this diagnosed? Your health care provider can diagnose GAD based on your symptoms and medical history. You will also have a physical exam. The health care provider will ask specific questions about your symptoms, including how severe they are, when they started, and if they come and go. Your health care provider may ask you about your use of alcohol or drugs, including prescription medicines. Your  health care provider may refer you to a mental health specialist for further evaluation. Your health care provider will do a thorough examination and may perform additional tests to rule out other possible causes of your symptoms. To be diagnosed with GAD, a person must have anxiety that:  Is out of his or her control.  Affects several different aspects of his or her life, such as work and relationships.  Causes distress that makes him or her unable to take part in normal activities.  Includes at least three physical symptoms of GAD, such  as restlessness, fatigue, trouble concentrating, irritability, muscle tension, or sleep problems. Before your health care provider can confirm a diagnosis of GAD, these symptoms must be present more days than they are not, and they must last for six months or longer. How is this treated? The following therapies are usually used to treat GAD:  Medicine. Antidepressant medicine is usually prescribed for long-term daily control. Antianxiety medicines may be added in severe cases, especially when panic attacks occur.  Talk therapy (psychotherapy). Certain types of talk therapy can be helpful in treating GAD by providing support, education, and guidance. Options include: ? Cognitive behavioral therapy (CBT). People learn coping skills and techniques to ease their anxiety. They learn to identify unrealistic or negative thoughts and behaviors and to replace them with positive ones. ? Acceptance and commitment therapy (ACT). This treatment teaches people how to be mindful as a way to cope with unwanted thoughts and feelings. ? Biofeedback. This process trains you to manage your body's response (physiological response) through breathing techniques and relaxation methods. You will work with a therapist while machines are used to monitor your physical symptoms.  Stress management techniques. These include yoga, meditation, and exercise. A mental health specialist can help determine which treatment is best for you. Some people see improvement with one type of therapy. However, other people require a combination of therapies. Follow these instructions at home:  Take over-the-counter and prescription medicines only as told by your health care provider.  Try to maintain a normal routine.  Try to anticipate stressful situations and allow extra time to manage them.  Practice any stress management or self-calming techniques as taught by your health care provider.  Do not punish yourself for setbacks or for not  making progress.  Try to recognize your accomplishments, even if they are small.  Keep all follow-up visits as told by your health care provider. This is important. Contact a health care provider if:  Your symptoms do not get better.  Your symptoms get worse.  You have signs of depression, such as: ? A persistently sad, cranky, or irritable mood. ? Loss of enjoyment in activities that used to bring you joy. ? Change in weight or eating. ? Changes in sleeping habits. ? Avoiding friends or family members. ? Loss of energy for normal tasks. ? Feelings of guilt or worthlessness. Get help right away if:  You have serious thoughts about hurting yourself or others. If you ever feel like you may hurt yourself or others, or have thoughts about taking your own life, get help right away. You can go to your nearest emergency department or call:  Your local emergency services (911 in the [place]  A suicide crisis helpline, such as the National Suicide Prevention Lifeline at 7696992618. This is open 24 hours a day. Summary  Generalized anxiety disorder (GAD) is a mental health disorder that involves worry that is not triggered by a specific event.  People  with GAD often worry excessively about many things in their lives, such as their health and family.  GAD may cause physical symptoms such as restlessness, trouble concentrating, sleep problems, frequent sweating, nausea, diarrhea, headaches, and trembling or muscle twitching.  A mental health specialist can help determine which treatment is best for you. Some people see improvement with one type of therapy. However, other people require a combination of therapies. This information is not intended to replace advice given to you by your health care provider. Make sure you discuss any questions you have with your health care provider. Document Revised: 10/04/2017 Document Reviewed: 09/11/2016 Elsevier Patient Education  2020 [employer].

## 2020-11-01 LAB — BETA HCG QUANT (REF LAB): hCG Quant: 163 m[IU]/mL

## 2020-11-05 DIAGNOSIS — Z419 Encounter for procedure for purposes other than remedying health state, unspecified: Secondary | ICD-10-CM | POA: Diagnosis not present

## 2020-11-05 NOTE — L&D Delivery Note (Signed)
Delivery Note At 12:28 AM a viable female was delivered via Vaginal, Spontaneous by EMT while en route to hospital.  Patient states contractions started ~ 30 minutes prior to calling EMS and was unresolved with tylenol dosing. Patient reports she had SROM right before delivery.   Upon arrival, to the unit, placenta remained intact and expelled spontaneously. Inspection not performed and placenta sent to pathology for further assessment.  Vaginal inspection revealed a periurethral and right labial laceration that were both hemostatic and left unrepaired repaired.  Patient given IM pit per protocol and Fundus firm, at the umbilicus, and bleeding small.  Mother hemodynamically stable and infant in warmer receiving O2 prior to provider exit.  Mother desires BTL for birth control and opts to bottlefeed.  Infant weight at one hour of life: 5 lb 14.5 oz (2678 g).    APGAR: 6, 8;     Anesthesia: None Episiotomy:  None Lacerations: Periurethral Suture Repair:  None Est. Blood Loss (mL):  Unknown  Mom to  [facility] for routine PP care .  Baby to NICU.  Cherre Robins 06/11/2021, 3:39 AM

## 2020-11-13 DIAGNOSIS — R059 Cough, unspecified: Secondary | ICD-10-CM | POA: Diagnosis not present

## 2020-11-13 DIAGNOSIS — R0981 Nasal congestion: Secondary | ICD-10-CM | POA: Diagnosis not present

## 2020-11-13 DIAGNOSIS — R6883 Chills (without fever): Secondary | ICD-10-CM | POA: Diagnosis not present

## 2020-11-13 DIAGNOSIS — R197 Diarrhea, unspecified: Secondary | ICD-10-CM | POA: Diagnosis not present

## 2020-12-06 DIAGNOSIS — Z419 Encounter for procedure for purposes other than remedying health state, unspecified: Secondary | ICD-10-CM | POA: Diagnosis not present

## 2020-12-14 ENCOUNTER — Other Ambulatory Visit: Payer: Self-pay | Admitting: Obstetrics & Gynecology

## 2020-12-14 DIAGNOSIS — O3680X0 Pregnancy with inconclusive fetal viability, not applicable or unspecified: Secondary | ICD-10-CM

## 2020-12-15 ENCOUNTER — Ambulatory Visit (INDEPENDENT_AMBULATORY_CARE_PROVIDER_SITE_OTHER): Payer: Medicaid Other

## 2020-12-15 ENCOUNTER — Other Ambulatory Visit: Payer: Self-pay

## 2020-12-15 DIAGNOSIS — O3680X0 Pregnancy with inconclusive fetal viability, not applicable or unspecified: Secondary | ICD-10-CM

## 2020-12-15 DIAGNOSIS — Z3A10 10 weeks gestation of pregnancy: Secondary | ICD-10-CM

## 2020-12-15 NOTE — Progress Notes (Signed)
[place] 10+6 wks,single IUP,FHR 164 bpm,normal ovaries

## 2021-01-01 DIAGNOSIS — O039 Complete or unspecified spontaneous abortion without complication: Secondary | ICD-10-CM | POA: Diagnosis not present

## 2021-01-01 DIAGNOSIS — O209 Hemorrhage in early pregnancy, unspecified: Secondary | ICD-10-CM | POA: Diagnosis not present

## 2021-01-01 DIAGNOSIS — Z3A11 11 weeks gestation of pregnancy: Secondary | ICD-10-CM | POA: Diagnosis not present

## 2021-01-01 DIAGNOSIS — O200 Threatened abortion: Secondary | ICD-10-CM | POA: Diagnosis not present

## 2021-01-02 ENCOUNTER — Telehealth: Payer: Self-pay

## 2021-01-02 NOTE — Telephone Encounter (Addendum)
Transition Care Management Unsuccessful Follow-up Telephone Call  Date of discharge and from where:  01/01/2021 from [facility]  Attempts:  1st Attempt  Reason for unsuccessful TCM follow-up call:  Left voice message  Pt has an appt with obgyn on 01/04/2021.

## 2021-01-03 DIAGNOSIS — A549 Gonococcal infection, unspecified: Secondary | ICD-10-CM

## 2021-01-03 DIAGNOSIS — Z419 Encounter for procedure for purposes other than remedying health state, unspecified: Secondary | ICD-10-CM | POA: Diagnosis not present

## 2021-01-03 HISTORY — DX: Gonococcal infection, unspecified: A54.9

## 2021-01-03 NOTE — Telephone Encounter (Signed)
Transition Care Management Follow-up Telephone Call  Date of discharge and from where: 01/01/2021 from [facility]  How have you been since you were released from the hospital? Pt stated that she is feeling okay but she does become out of breath when doing small tasks. Pt states that this is unusual for her and is something that has only started recently.   Any questions or concerns? No  Items Reviewed:  Did the pt receive and understand the discharge instructions provided? Yes   Medications obtained and verified? Yes   Other? No   Any new allergies since your discharge? No   Dietary orders reviewed? n/a  Do you have support at home? Yes    Functional Questionnaire: (I = Independent and D = Dependent) ADLs: I  Bathing/Dressing- I  Meal Prep- I  Eating- I  Maintaining continence- I  Transferring/Ambulation- I  Managing Meds- I   Follow up appointments reviewed:   PCP Hospital f/u appt confirmed? No   Specialist Hospital f/u appt confirmed? Yes  Scheduled to see Shawna Clamp, CNM on 01/04/2021 @ 11:50am.  Are transportation arrangements needed? No   If their condition worsens, is the pt aware to call PCP or go to the Emergency Dept.? Yes  Was the patient provided with contact information for the PCP's office or ED? Yes  Was to pt encouraged to call back with questions or concerns? Yes

## 2021-01-04 ENCOUNTER — Other Ambulatory Visit (HOSPITAL_COMMUNITY)
Admission: RE | Admit: 2021-01-04 | Discharge: 2021-01-04 | Disposition: A | Discharge status: Home / Self Care | Payer: Medicaid Other | Source: Ambulatory Visit | Attending: Obstetrics & Gynecology | Admitting: Obstetrics & Gynecology

## 2021-01-04 ENCOUNTER — Other Ambulatory Visit: Payer: Self-pay

## 2021-01-04 ENCOUNTER — Ambulatory Visit: Payer: Medicaid Other | Admitting: *Deleted

## 2021-01-04 ENCOUNTER — Ambulatory Visit (INDEPENDENT_AMBULATORY_CARE_PROVIDER_SITE_OTHER): Payer: Medicaid Other | Admitting: Women's Health

## 2021-01-04 ENCOUNTER — Encounter: Payer: Self-pay | Admitting: Women's Health

## 2021-01-04 VITALS — BP 113/74 | HR 98 | Wt 140.0 lb

## 2021-01-04 DIAGNOSIS — Z3A13 13 weeks gestation of pregnancy: Secondary | ICD-10-CM | POA: Diagnosis not present

## 2021-01-04 DIAGNOSIS — Z3481 Encounter for supervision of other normal pregnancy, first trimester: Secondary | ICD-10-CM

## 2021-01-04 DIAGNOSIS — Z3480 Encounter for supervision of other normal pregnancy, unspecified trimester: Secondary | ICD-10-CM | POA: Diagnosis not present

## 2021-01-04 DIAGNOSIS — F418 Other specified anxiety disorders: Secondary | ICD-10-CM

## 2021-01-04 DIAGNOSIS — Z3490 Encounter for supervision of normal pregnancy, unspecified, unspecified trimester: Secondary | ICD-10-CM | POA: Insufficient documentation

## 2021-01-04 DIAGNOSIS — Z363 Encounter for antenatal screening for malformations: Secondary | ICD-10-CM | POA: Diagnosis not present

## 2021-01-04 DIAGNOSIS — Z8751 Personal history of pre-term labor: Secondary | ICD-10-CM | POA: Diagnosis not present

## 2021-01-04 DIAGNOSIS — Z31430 Encounter of female for testing for genetic disease carrier status for procreative management: Secondary | ICD-10-CM | POA: Diagnosis not present

## 2021-01-04 LAB — POCT URINALYSIS DIPSTICK OB
Blood, UA: NEGATIVE
Glucose, UA: NEGATIVE
Ketones, UA: NEGATIVE
Leukocytes, UA: NEGATIVE
Nitrite, UA: NEGATIVE
POC,PROTEIN,UA: NEGATIVE

## 2021-01-04 MED ORDER — BLOOD PRESSURE MONITOR MISC
0 refills | Status: DC
Start: 2021-01-04 — End: 2021-05-01

## 2021-01-04 MED ORDER — CITRANATAL ASSURE 35-1 & 300 MG PO MISC: ORAL | 3 refills | Status: AC

## 2021-01-04 MED ORDER — CITALOPRAM HYDROBROMIDE 20 MG PO TABS: ORAL_TABLET | ORAL | 2 refills | Status: DC

## 2021-01-04 NOTE — Progress Notes (Signed)
INITIAL OBSTETRICAL VISIT Patient name: Cheryl Frank MRN [id number]  Date of birth: 2001/06/22 Chief Complaint:   Initial Prenatal Visit  History of Present Illness:   Cheryl Frank is a 20 y.o. G29P1102 Caucasian female at [redacted]w[redacted]d by [place] at 10 weeks with an Estimated Date of Delivery: 07/07/21 being seen today for her initial obstetrical visit.   Her obstetrical history is significant for term uncomplicated SVB x 1, then 33wk PPROM/PTL w/ unintentional birth on front steps of her house.   Today she reports fatigue. Dep/anx- PCP started her on prozac few months ago, not helping at all. Denies SI/HI. Wants to switch meds.  Spotting/cramping few days ago, went to [facility] and they did not do u/s. Spotting has stopped, still having some cramping.  Depression screen [facility] 2/9 01/04/2021 10/31/2020 07/13/2020 12/30/2017 11/20/2017  Decreased Interest 1 1 1  0 0  Down, Depressed, Hopeless 2 1 2  0 0  PHQ - 2 Score 3 2 3  0 0  Altered sleeping 1 0 1 - -  Tired, decreased energy 3 1 2  - -  Change in appetite 1 1 2  - -  Feeling bad or failure about yourself  0 1 1 - -  Trouble concentrating 1 1 3  - -  Moving slowly or fidgety/restless 1 1 1  - -  Suicidal thoughts 0 0 0 - -  PHQ-9 Score 10 7 13  - -  Difficult doing work/chores - Somewhat difficult - - -    Patient's last menstrual period was 06/22/2020 (approximate). Last pap 02/26/19. Results were: ASCUS w/ HRHPV positive Review of Systems:   Pertinent items are noted in HPI Denies cramping/contractions, leakage of fluid, vaginal bleeding, abnormal vaginal discharge w/ itching/odor/irritation, headaches, visual changes, shortness of breath, chest pain, abdominal pain, severe nausea/vomiting, or problems with urination or bowel movements unless otherwise stated above.  Pertinent History Reviewed:  Reviewed past medical,surgical, social, obstetrical and family history.  Reviewed problem list, medications and allergies. OB History  Gravida Para Term Preterm AB  Living  3 2 1 1   2   SAB IAB Ectopic Multiple Live Births          2    # Outcome Date GA Lbr Len/2nd Weight Sex Delivery Anes PTL Lv  3 Current           2 Preterm 07/23/19 [redacted]w[redacted]d  4 lb 3 oz (1.899 kg) M Vag-Spont None N LIV     Complications: Preterm premature rupture of membranes  1 Term 10/19/18 [redacted]w[redacted]d  7 lb 4 oz (3.289 kg) M Vag-Spont EPI N LIV   Physical Assessment:   Vitals:   01/04/21 1233  BP: 113/74  Pulse: 98  Weight: 140 lb (63.5 kg)  Body mass index is 25.61 kg/m.       Physical Examination:  General appearance - well appearing, and in no distress  Mental status - alert, oriented to person, place, and time  Psych:  She has a normal mood and affect  Skin - warm and dry, normal color, no suspicious lesions noted  Chest - effort normal, all lung fields clear to auscultation bilaterally  Heart - normal rate and regular rhythm  Abdomen - soft, nontender  Extremities:  No swelling or varicosities noted  Pelvic - VULVA: normal appearing vulva with no masses, tenderness or lesions  VAGINA: normal appearing vagina with normal color and discharge, no lesions  CERVIX: normal appearing cervix without discharge or lesions, no CMT  Thin prep pap is done  w/ HR HPV cotesting  Chaperone: [employer]    Informal TA u/s: active fetus w/ +FCA  Results for orders placed or performed in visit on 01/04/21 (from the past 24 hour(s))  POC Urinalysis Dipstick OB   Collection Time: 01/04/21 12:15 PM  Result Value Ref Range   Color, UA     Clarity, UA     Glucose, UA Negative Negative   Bilirubin, UA     Ketones, UA neg    Spec Grav, UA     Blood, UA neg    pH, UA     POC,PROTEIN,UA Negative Negative, Trace, Small (1+), Moderate (2+), Large (3+), 4+   Urobilinogen, UA     Nitrite, UA neg    Leukocytes, UA Negative Negative   Appearance     Odor      Assessment & Plan:  1) Low-Risk Pregnancy [id number] at [redacted]w[redacted]d with an Estimated Date of Delivery: 07/07/21   2) Initial OB  visit  3) H/O 33wk PTB d/t PPROM/PTL> discussed Makena, wants, ordered today, f/u 3/18 for 1st weekly injection  4) Dep/anx> prozac not helping, stop prozac, rx celexa. Referral to Jamie/IBH  Meds:  Meds ordered this encounter  Medications  . Blood Pressure Monitor MISC    Sig: For regular home bp monitoring during pregnancy    Dispense:  1 each    Refill:  0    Z34.81  . citalopram (CELEXA) 20 MG tablet    Sig: Take 10mg  (1/2 tablet) daily x 1 week, then 20mg  (1 tablet) daily thereafter    Dispense:  30 tablet    Refill:  2    Order Specific Question:   Supervising Provider    Answer:   , LUTHER H [[id number]]  . Prenat w/o [id number] (CITRANATAL ASSURE) 35-1 & 300 MG tablet    Sig: One tablet and one capsule daily    Dispense:  90 each    Refill:  3    Order Specific Question:   Supervising Provider    Answer:   H [[id number]]    Initial labs obtained Continue prenatal vitamins Reviewed n/v relief measures and warning s/s to report Reviewed recommended weight gain based on pre-gravid BMI Encouraged well-balanced diet Genetic & carrier screening discussed: requests Panorama, AFP and Horizon 14 , too late for nt/it Ultrasound discussed; fetal survey: requested CCNC completed> form faxed if has or is planning to apply for medicaid The nature of [facility] with multiple MDs and other Advanced Practice Providers was explained to patient; also emphasized that fellows, residents, and students are part of our team. Does not have home bp cuff. Rx faxed to [facility]. Check bp weekly, let Duane Lope know if >140/90.   Follow-up: Return for 3/18 for Makena injection; then weekly injections. 4/1 for lrob w/ anatomy u/s, AFP in person w/ cnm.   Orders Placed This Encounter  Procedures  . Urine Culture  . US OB Comp + 14 Wk  . Genetic Screening  . Pain Management Screening Profile (10S)  . CBC/D/Plt+RPR+Rh+ABO+Rub Ab...  . Amb ref to 4/18  . POC Urinalysis Dipstick OB    [place] CNM, [facility] 01/04/2021 12:44 PM

## 2021-01-04 NOTE — Progress Notes (Signed)
    [facility]  SUBJECTIVE:  Cheryl Frank is a 20 y.o. G6P1102 female here for Panorama NIPT and Horizon Carrier Screening . She is [redacted]w[redacted]d pregnant.   OBJECTIVE:  Appears well, in no apparent distress  Blood work drawn from right [facility] without difficulty. 1 attempt(s).   ASSESSMENT: Pregnancy [redacted]w[redacted]d Panorama NIPT and Horizon Carrier Screening  PLAN: [facility] portal information given and instructed patient how to access results  Jobe Marker  01/04/2021 12:50 PM

## 2021-01-04 NOTE — Patient Instructions (Signed)
Cheryl Frank, I greatly value your feedback.  If you receive a survey following your visit with [place] today, we appreciate you taking the time to fill it out.  Thanks, Joellyn Haff, CNM, WHNP-BC   [facility] ([address] [place], [place] [address]) Entrance C, located off of [address] Free 24/7 valet parking   Nausea & Vomiting  Have saltine crackers or pretzels by your bed and eat a few bites before you raise your head out of bed in the morning  Eat small frequent meals throughout the day instead of large meals  Drink plenty of fluids throughout the day to stay hydrated, just don't drink a lot of fluids with your meals.  This can make your stomach fill up faster making you feel sick  Do not brush your teeth right after you eat  Products with real ginger are good for nausea, like ginger ale and ginger hard candy Make sure it says made with real ginger!  Sucking on sour candy like lemon heads is also good for nausea  If your prenatal vitamins make you nauseated, take them at night so you will sleep through the nausea  Sea Bands  If you feel like you need medicine for the nausea & vomiting please let us know  If you are unable to keep any fluids or food down please let us know   Constipation  Drink plenty of fluid, preferably water, throughout the day  Eat foods high in fiber such as fruits, vegetables, and grains  Exercise, such as walking, is a good way to keep your bowels regular  Drink warm fluids, especially warm prune juice, or decaf coffee  Eat a 1/2 cup of real oatmeal (not instant), 1/2 cup applesauce, and 1/2-1 cup warm prune juice every day  If needed, you may take Colace (docusate sodium) stool softener once or twice a day to help keep the stool soft.   If you still are having problems with constipation, you may take Miralax once daily as needed to help keep your bowels regular.   Home Blood Pressure Monitoring for Patients    Your provider has recommended that you check your blood pressure (BP) at least once a week at home. If you do not have a blood pressure cuff at home, one will be provided for you. Contact your provider if you have not received your monitor within 1 week.   Helpful Tips for Accurate Home Blood Pressure Checks  . Don't smoke, exercise, or drink caffeine 30 minutes before checking your BP . Use the restroom before checking your BP (a full bladder can raise your pressure) . Relax in a comfortable upright chair . Feet on the ground . Left arm resting comfortably on a flat surface at the level of your heart . Legs uncrossed . Back supported . Sit quietly and don't talk . Place the cuff on your bare arm . Adjust snuggly, so that only two fingertips can fit between your skin and the top of the cuff . Check 2 readings separated by at least one minute . Keep a log of your BP readings . For a visual, please reference this diagram: http://ccnc.care/bpdiagram  Provider Name: [facility]     Phone: [phone]  Zone 1: ALL CLEAR  Continue to monitor your symptoms:  . BP reading is less than 140 (top number) or less than 90 (bottom number)  . No right upper stomach pain . No headaches or  seeing spots . No feeling nauseated or throwing up . No swelling in face and hands  Zone 2: CAUTION Call your doctor's office for any of the following:  . BP reading is greater than 140 (top number) or greater than 90 (bottom number)  . Stomach pain under your ribs in the middle or right side . Headaches or seeing spots . Feeling nauseated or throwing up . Swelling in face and hands  Zone 3: EMERGENCY  Seek immediate medical care if you have any of the following:  . BP reading is greater than160 (top number) or greater than 110 (bottom number) . Severe headaches not improving with Tylenol . Serious difficulty catching your breath . Any worsening symptoms from Zone 2    First Trimester of  Pregnancy The first trimester of pregnancy is from week 1 until the end of week 12 (months 1 through 3). A week after a sperm fertilizes an egg, the egg will implant on the wall of the uterus. This embryo will begin to develop into a baby. Genes from you and your partner are forming the baby. The female genes determine whether the baby is a boy or a girl. At 6-8 weeks, the eyes and face are formed, and the heartbeat can be seen on ultrasound. At the end of 12 weeks, all the baby's organs are formed.  Now that you are pregnant, you will want to do everything you can to have a healthy baby. Two of the most important things are to get good prenatal care and to follow your health care provider's instructions. Prenatal care is all the medical care you receive before the baby's birth. This care will help prevent, find, and treat any problems during the pregnancy and childbirth. BODY CHANGES Your body goes through many changes during pregnancy. The changes vary from woman to woman.   You may gain or lose a couple of pounds at first.  You may feel sick to your stomach (nauseous) and throw up (vomit). If the vomiting is uncontrollable, call your health care provider.  You may tire easily.  You may develop headaches that can be relieved by medicines approved by your health care provider.  You may urinate more often. Painful urination may mean you have a bladder infection.  You may develop heartburn as a result of your pregnancy.  You may develop constipation because certain hormones are causing the muscles that push waste through your intestines to slow down.  You may develop hemorrhoids or swollen, bulging veins (varicose veins).  Your breasts may begin to grow larger and become tender. Your nipples may stick out more, and the tissue that surrounds them (areola) may become darker.  Your gums may bleed and may be sensitive to brushing and flossing.  Dark spots or blotches (chloasma, mask of pregnancy)  may develop on your face. This will likely fade after the baby is born.  Your menstrual periods will stop.  You may have a loss of appetite.  You may develop cravings for certain kinds of food.  You may have changes in your emotions from day to day, such as being excited to be pregnant or being concerned that something may go wrong with the pregnancy and baby.  You may have more vivid and strange dreams.  You may have changes in your hair. These can include thickening of your hair, rapid growth, and changes in texture. Some women also have hair loss during or after pregnancy, or hair that feels dry or thin. Your  hair will most likely return to normal after your baby is born. WHAT TO EXPECT AT YOUR PRENATAL VISITS During a routine prenatal visit:  You will be weighed to make sure you and the baby are growing normally.  Your blood pressure will be taken.  Your abdomen will be measured to track your baby's growth.  The fetal heartbeat will be listened to starting around week 10 or 12 of your pregnancy.  Test results from any previous visits will be discussed. Your health care provider may ask you:  How you are feeling.  If you are feeling the baby move.  If you have had any abnormal symptoms, such as leaking fluid, bleeding, severe headaches, or abdominal cramping.  If you have any questions. Other tests that may be performed during your first trimester include:  Blood tests to find your blood type and to check for the presence of any previous infections. They will also be used to check for low iron levels (anemia) and Rh antibodies. Later in the pregnancy, blood tests for diabetes will be done along with other tests if problems develop.  Urine tests to check for infections, diabetes, or protein in the urine.  An ultrasound to confirm the proper growth and development of the baby.  An amniocentesis to check for possible genetic problems.  Fetal screens for spina bifida and  Down syndrome.  You may need other tests to make sure you and the baby are doing well. HOME CARE INSTRUCTIONS  Medicines  Follow your health care provider's instructions regarding medicine use. Specific medicines may be either safe or unsafe to take during pregnancy.  Take your prenatal vitamins as directed.  If you develop constipation, try taking a stool softener if your health care provider approves. Diet  Eat regular, well-balanced meals. Choose a variety of foods, such as meat or vegetable-based protein, fish, milk and low-fat dairy products, vegetables, fruits, and whole grain breads and cereals. Your health care provider will help you determine the amount of weight gain that is right for you.  Avoid raw meat and uncooked cheese. These carry germs that can cause birth defects in the baby.  Eating four or five small meals rather than three large meals a day may help relieve nausea and vomiting. If you start to feel nauseous, eating a few soda crackers can be helpful. Drinking liquids between meals instead of during meals also seems to help nausea and vomiting.  If you develop constipation, eat more high-fiber foods, such as fresh vegetables or fruit and whole grains. Drink enough fluids to keep your urine clear or pale yellow. Activity and Exercise  Exercise only as directed by your health care provider. Exercising will help you:  Control your weight.  Stay in shape.  Be prepared for labor and delivery.  Experiencing pain or cramping in the lower abdomen or low back is a good sign that you should stop exercising. Check with your health care provider before continuing normal exercises.  Try to avoid standing for long periods of time. Move your legs often if you must stand in one place for a long time.  Avoid heavy lifting.  Wear low-heeled shoes, and practice good posture.  You may continue to have sex unless your health care provider directs you otherwise. Relief of Pain  or Discomfort  Wear a good support bra for breast tenderness.    Take warm sitz baths to soothe any pain or discomfort caused by hemorrhoids. Use hemorrhoid cream if your health care provider  approves.    Rest with your legs elevated if you have leg cramps or low back pain.  If you develop varicose veins in your legs, wear support hose. Elevate your feet for 15 minutes, 3-4 times a day. Limit salt in your diet. Prenatal Care  Schedule your prenatal visits by the twelfth week of pregnancy. They are usually scheduled monthly at first, then more often in the last 2 months before delivery.  Write down your questions. Take them to your prenatal visits.  Keep all your prenatal visits as directed by your health care provider. Safety  Wear your seat belt at all times when driving.  Make a list of emergency phone numbers, including numbers for family, friends, the hospital, and police and fire departments. General Tips  Ask your health care provider for a referral to a local prenatal education class. Begin classes no later than at the beginning of month 6 of your pregnancy.  Ask for help if you have counseling or nutritional needs during pregnancy. Your health care provider can offer advice or refer you to specialists for help with various needs.  Do not use hot tubs, steam rooms, or saunas.  Do not douche or use tampons or scented sanitary pads.  Do not cross your legs for long periods of time.  Avoid cat litter boxes and soil used by cats. These carry germs that can cause birth defects in the baby and possibly loss of the fetus by miscarriage or stillbirth.  Avoid all smoking, herbs, alcohol, and medicines not prescribed by your health care provider. Chemicals in these affect the formation and growth of the baby.  Schedule a dentist appointment. At home, brush your teeth with a soft toothbrush and be gentle when you floss. SEEK MEDICAL CARE IF:   You have dizziness.  You have mild  pelvic cramps, pelvic pressure, or nagging pain in the abdominal area.  You have persistent nausea, vomiting, or diarrhea.  You have a bad smelling vaginal discharge.  You have pain with urination.  You notice increased swelling in your face, hands, legs, or ankles. SEEK IMMEDIATE MEDICAL CARE IF:   You have a fever.  You are leaking fluid from your vagina.  You have spotting or bleeding from your vagina.  You have severe abdominal cramping or pain.  You have rapid weight gain or loss.  You vomit blood or material that looks like coffee grounds.  You are exposed to [place] measles and have never had them.  You are exposed to fifth disease or chickenpox.  You develop a severe headache.  You have shortness of breath.  You have any kind of trauma, such as from a fall or a car accident. Document Released: 10/16/2001 Document Revised: 03/08/2014 Document Reviewed: 09/01/2013 [facility] Patient Information 2015 [place], [place]. This information is not intended to replace advice given to you by your health care provider. Make sure you discuss any questions you have with your health care provider.

## 2021-01-05 ENCOUNTER — Encounter: Payer: Self-pay | Admitting: Women's Health

## 2021-01-05 DIAGNOSIS — A549 Gonococcal infection, unspecified: Secondary | ICD-10-CM | POA: Insufficient documentation

## 2021-01-05 LAB — CBC/D/PLT+RPR+RH+ABO+RUB AB...
Antibody Screen: NEGATIVE
Basophils Absolute: 0 10*3/uL (ref 0.0–0.2)
Basos: 0 %
EOS (ABSOLUTE): 0.1 10*3/uL (ref 0.0–0.4)
Eos: 1 %
HCV Ab: <0.1 s/co ratio (ref 0.0–0.9)
HIV Screen 4th Generation wRfx: NONREACTIVE
Hematocrit: 38.6 % (ref 34.0–46.6)
Hemoglobin: 12.8 g/dL (ref 11.1–15.9)
Hepatitis B Surface Ag: NEGATIVE
Immature Grans (Abs): 0 10*3/uL (ref 0.0–0.1)
Immature Granulocytes: 0 %
Lymphocytes Absolute: 1.3 10*3/uL (ref 0.7–3.1)
Lymphs: 18 %
MCH: 28.7 pg (ref 26.6–33.0)
MCHC: 33.2 g/dL (ref 31.5–35.7)
MCV: 87 fL (ref 79–97)
Monocytes Absolute: 0.4 10*3/uL (ref 0.1–0.9)
Monocytes: 6 %
Neutrophils Absolute: 5.4 10*3/uL (ref 1.4–7.0)
Neutrophils: 75 %
Platelets: 201 10*3/uL (ref 150–450)
RBC: 4.46 x10E6/uL (ref 3.77–5.28)
RDW: 12.8 % (ref 11.7–15.4)
RPR Ser Ql: NONREACTIVE
Rh Factor: POSITIVE
Rubella Antibodies, IGG: 1.48 index (ref >0.99)
WBC: 7.2 10*3/uL (ref 3.4–10.8)

## 2021-01-05 LAB — PMP SCREEN PROFILE (10S), URINE
Amphetamine Scrn, Ur: NEGATIVE ng/mL
BARBITURATE SCREEN URINE: NEGATIVE ng/mL
BENZODIAZEPINE SCREEN, URINE: NEGATIVE ng/mL
CANNABINOIDS UR QL SCN: NEGATIVE ng/mL
Cocaine (Metab) Scrn, Ur: NEGATIVE ng/mL
Creatinine(Crt), U: 141.9 mg/dL (ref 20.0–300.0)
Methadone Screen, Urine: NEGATIVE ng/mL
OXYCODONE+OXYMORPHONE UR QL SCN: NEGATIVE ng/mL
Opiate Scrn, Ur: NEGATIVE ng/mL
Ph of Urine: 6.7 (ref 4.5–8.9)
Phencyclidine Qn, Ur: NEGATIVE ng/mL
Propoxyphene Scrn, Ur: NEGATIVE ng/mL

## 2021-01-05 LAB — HCV INTERPRETATION

## 2021-01-05 LAB — CYTOLOGY - PAP
Chlamydia: NEGATIVE
Comment: NEGATIVE
Comment: NORMAL
Diagnosis: NEGATIVE
Neisseria Gonorrhea: POSITIVE — AB

## 2021-01-09 ENCOUNTER — Telehealth: Payer: Self-pay | Admitting: *Deleted

## 2021-01-09 LAB — URINE CULTURE

## 2021-01-09 NOTE — Telephone Encounter (Signed)
Left message @ 3:04 pm. JSY

## 2021-01-09 NOTE — Telephone Encounter (Signed)
-----   Message from [name], [place] sent at 01/09/2021  2:31 PM EST ----- Hasn't read [employer]. Please call and read it to her/have her read it. Schedule her to come in for rocephin injection. Thanks

## 2021-01-10 ENCOUNTER — Other Ambulatory Visit: Payer: Self-pay

## 2021-01-10 ENCOUNTER — Other Ambulatory Visit (INDEPENDENT_AMBULATORY_CARE_PROVIDER_SITE_OTHER): Payer: Medicaid Other

## 2021-01-10 ENCOUNTER — Other Ambulatory Visit: Payer: Self-pay | Admitting: Women's Health

## 2021-01-10 DIAGNOSIS — A549 Gonococcal infection, unspecified: Secondary | ICD-10-CM

## 2021-01-10 MED ORDER — AZITHROMYCIN 500 MG PO TABS: 2000.0000 mg | ORAL_TABLET | Freq: Once | ORAL | 0 refills | Status: AC

## 2021-01-10 NOTE — Telephone Encounter (Signed)
-----   Message from Kimberly R Booker, CNM sent at 01/09/2021  2:31 PM EST ----- Hasn't read mychart message. Please call and read it to her/have her read it. Schedule her to come in for rocephin injection. Thanks  

## 2021-01-10 NOTE — Telephone Encounter (Signed)
Pt aware pap is normal but + for GC. Pt advised she needs an injection and partner needs to be treated at his dr or at health dept. No sex x 7 days after both have finished med. Pt voiced understanding and call was transferred to [facility] for nurse visit for injection and 4 weeks for POC appt. JSY

## 2021-01-10 NOTE — Progress Notes (Signed)
   NURSE VISIT- INJECTION  SUBJECTIVE:  Cheryl Frank is a 20 y.o. G76P1102 female here for a Rocephin for treatment for gonorrhea. She is [redacted]w[redacted]d pregnant.   OBJECTIVE:  LMP 06/22/2020 (Approximate)   Appears well, in no apparent distress  Patient noted to be allergic to PCN.  Discussed with Dr Despina Hidden and Cheryl Frank, CNM allergy and reaction.  Will have patient take Azithromycin 2gm once. Advised no sex for 4 weeks.   No orders of the defined types were placed in this encounter.   ASSESSMENT: Pregnancy [redacted]w[redacted]d here for injection but not administered due to allergy. PLAN: Follow-up: in 4 weeks for proof of cure   Jobe Marker  01/10/2021 4:44 PM

## 2021-01-12 ENCOUNTER — Telehealth: Payer: Self-pay

## 2021-01-12 ENCOUNTER — Other Ambulatory Visit: Payer: Self-pay

## 2021-01-12 NOTE — Telephone Encounter (Signed)
Called pt. Cell # vm gave different names. Called home #, call rejected. Called cell # again and left vm msg stating that if there was a pt of this office at that #, to please call the office back.

## 2021-01-12 NOTE — Patient Outreach (Signed)
Care Coordination  01/12/2021  Cheryl Frank 03-03-2001 [id number]   Medicaid Managed Care   Unsuccessful Outreach Note  01/12/2021 Name: Cheryl Frank MRN: [id number] DOB: 09-Apr-2001  Referred by: Gwenlyn Fudge, FNP Reason for referral : High Risk Managed Medicaid (MM Screen Unsuccessful Telephone Call)   An unsuccessful telephone outreach was attempted today. The patient was referred to the case management team for assistance with care management and care coordination.   Follow Up Plan: The care management team will reach out to the patient again over the next 7 days.   Gus Puma, BSW, [place] [facility]  [place]  High Risk Managed Medicaid Team

## 2021-01-12 NOTE — Patient Instructions (Signed)
Visit Information  Cheryl Frank  - as a part of your Medicaid benefit, you are eligible for care management and care coordination services at no cost or copay. I was unable to reach you by phone today but would be happy to help you with your health related needs. Please feel free to call me @ [phone].   A member of the Managed Medicaid care management team will reach out to you again over the next 7 days.   Gus Puma, BSW, [place] [facility]  [place]  High Risk Managed Medicaid Team

## 2021-01-12 NOTE — Telephone Encounter (Signed)
Have pt to come in tomorrow am for rocephin injection

## 2021-01-12 NOTE — Telephone Encounter (Signed)
Pt called stating that the medication she received on 01/10/21 for gonorrhea she vomited that medication up and is wondering if there is something else that she can take of if there is an injection

## 2021-01-13 ENCOUNTER — Ambulatory Visit: Payer: Medicaid Other

## 2021-01-13 DIAGNOSIS — Z3480 Encounter for supervision of other normal pregnancy, unspecified trimester: Secondary | ICD-10-CM | POA: Diagnosis not present

## 2021-01-16 ENCOUNTER — Ambulatory Visit (INDEPENDENT_AMBULATORY_CARE_PROVIDER_SITE_OTHER): Payer: Medicaid Other | Admitting: *Deleted

## 2021-01-16 ENCOUNTER — Other Ambulatory Visit: Payer: Self-pay

## 2021-01-16 ENCOUNTER — Encounter: Payer: Self-pay | Admitting: *Deleted

## 2021-01-16 VITALS — BP 111/67 | HR 93 | Wt 137.0 lb

## 2021-01-16 DIAGNOSIS — A549 Gonococcal infection, unspecified: Secondary | ICD-10-CM

## 2021-01-16 DIAGNOSIS — Z3480 Encounter for supervision of other normal pregnancy, unspecified trimester: Secondary | ICD-10-CM

## 2021-01-16 MED ORDER — CEFTRIAXONE SODIUM 250 MG IJ SOLR
250.0000 mg | Freq: Once | INTRAMUSCULAR | Status: AC
  Administered 2021-01-16: 250 mg via INTRAMUSCULAR

## 2021-01-16 NOTE — Progress Notes (Addendum)
   NURSE VISIT- INJECTION  SUBJECTIVE:  Cheryl Frank is a 20 y.o. G43P1102 female here for a Rocephin for treatment for gonorrhea. She is [redacted]w[redacted]d pregnant.   OBJECTIVE:  BP 111/67   Pulse 93   Wt 137 lb (62.1 kg)   LMP 06/22/2020 (Approximate)   Breastfeeding No   BMI 25.06 kg/m   Appears well, in no apparent distress  Injection administered in: Right upper quad. gluteus  Meds ordered this encounter  Medications  . cefTRIAXone (ROCEPHIN) injection 250 mg    ASSESSMENT: Pregnancy [redacted]w[redacted]d Rocephin for treatment for gonorrhea. Pt received Rocephin 500 mg IM.  PLAN: Follow-up: as scheduled   Malachy Mood  01/16/2021 3:53 PM   Chart reviewed for nurse visit. Agree with plan of care. Rx phenergan for nausea. [name], [place] 01/18/2021 10:46 AM

## 2021-01-16 NOTE — BH Specialist Note (Signed)
Pt did not arrive to video visit and did not answer the phone ; Left HIPPA-compliant message to call back Bhumi Godbey from [facility] at [phone] (main office) or [phone] ([address]'s office).  ; left MyChart message for patient.      

## 2021-01-18 MED ORDER — PROMETHAZINE HCL 25 MG PO TABS: 12.5000 mg | ORAL_TABLET | Freq: Four times a day (QID) | ORAL | 0 refills | Status: DC | PRN

## 2021-01-18 NOTE — Addendum Note (Signed)
Addended by: [name] on: 01/18/2021 10:47 AM   Modules accepted: Orders

## 2021-01-20 ENCOUNTER — Other Ambulatory Visit: Payer: Self-pay

## 2021-01-20 ENCOUNTER — Ambulatory Visit (INDEPENDENT_AMBULATORY_CARE_PROVIDER_SITE_OTHER): Payer: Medicaid Other | Admitting: *Deleted

## 2021-01-20 DIAGNOSIS — Z8751 Personal history of pre-term labor: Secondary | ICD-10-CM

## 2021-01-20 MED ORDER — HYDROXYPROGESTERONE CAPROATE 275 MG/1.1ML ~~LOC~~ SOAJ
275.0000 mg | SUBCUTANEOUS | Status: DC
  Administered 2021-01-20 – 2021-02-03 (×3): 275 mg via SUBCUTANEOUS

## 2021-01-20 NOTE — Progress Notes (Addendum)
   NURSE VISIT- INJECTION  SUBJECTIVE:  Cheryl Frank is a 20 y.o. G23P1102 female here for a Makena for history of preterm birth. She is [redacted]w[redacted]d pregnant.   OBJECTIVE:  BP 111/67   Pulse 93   LMP 06/22/2020 (Approximate)   Appears well, in no apparent distress  Injection administered in: Right arm  Meds ordered this encounter  Medications  . HYDROXYprogesterone caproate (Makena) autoinjector 275 mg    ASSESSMENT: Pregnancy [redacted]w[redacted]d Makena for history of preterm birth PLAN: Follow-up: in 1 week for next Makena   Annamarie Dawley  01/20/2021 1:18 PM   Chart reviewed for nurse visit. Agree with plan of care.  [name], [place] 01/20/2021 1:53 PM

## 2021-01-23 ENCOUNTER — Other Ambulatory Visit: Payer: Self-pay

## 2021-01-23 ENCOUNTER — Ambulatory Visit: Payer: Self-pay | Admitting: Clinical

## 2021-01-23 DIAGNOSIS — Z91199 Patient's noncompliance with other medical treatment and regimen due to unspecified reason: Secondary | ICD-10-CM

## 2021-01-23 DIAGNOSIS — Z5329 Procedure and treatment not carried out because of patient's decision for other reasons: Secondary | ICD-10-CM

## 2021-01-23 NOTE — Patient Outreach (Signed)
  Medicaid Managed Care Social Work Note  01/23/2021 Name:  Cheryl Frank MRN:  [id number] DOB:  2001/04/09  Cheryl Frank is an 20 y.o. year old female who is a primary patient of Gwenlyn Fudge, FNP.  The [facility] Managed Care Coordination team was consulted for assistance with:  Transportation Needs   Ms. Knieriem was given information about Medicaid Managed CareCoordination services today. [name] agreed to services and verbal consent obtained.  Engaged with patient  for by telephone forinitial visit in response to referral for case management and/or care coordination services.   Assessments/Interventions:  Review of past medical history, allergies, medications, health status, including review of consultants reports, laboratory and other test data, was performed as part of comprehensive evaluation and provision of chronic care management services.  SDOH: (Social Determinant of Health) assessments and interventions performed:  BSW contacted patient in regards in transportation and immediate needs. Patient stated that she did not have transportation to appointments. BSW provided patient with [facility] transportation services, patient asked for information to be placed in MyChart. Patient stated no other resources were needed and declined follow up services.   Advanced Directives Status:  Not addressed in this encounter.  Care Plan                 Allergies  Allergen Reactions  . Penicillins Anaphylaxis    Throat swells    Medications Reviewed Today    Reviewed by Colen Darling, LPN (Licensed Practical Nurse) on 01/16/21 at 1529  Med List Status: <None>  Medication Order Taking? Sig Documenting Provider Last Dose Status Informant  albuterol (VENTOLIN HFA) 108 (90 Base) MCG/ACT inhaler [id number] Yes Inhale 2 puffs into the lungs every 6 (six) hours as needed. [name], FNP Taking Active   Blood Pressure Monitor MISC [id number] No For regular home bp monitoring during  pregnancy  Patient not taking: Reported on 01/16/2021   [name], CNM Not Taking Active   citalopram (CELEXA) 20 MG tablet [id number] Yes Take 10mg  (1/2 tablet) daily x 1 week, then 20mg  (1 tablet) daily thereafter , CNM Taking Active   fluticasone (FLOVENT DISKUS) 50 MCG/BLIST diskus inhaler Yes Inhale 1 puff into the lungs 2 (two) times daily. [name], FNP Taking Active   Prenat w/o A-[id number] [facility] ASSURE) 35-1 & 300 MG tablet [name] Yes One tablet and one capsule daily [facility], [id number] Taking Active           Patient Active Problem List   Diagnosis Date Noted  . Gonorrhea 01/05/2021  . Encounter for supervision of normal pregnancy, antepartum 01/04/2021  . History of preterm delivery 01/04/2021  . Seasonal allergic rhinitis due to pollen 03/04/2017  . Mild asthma 03/04/2017  . Migraine 09/14/2016  . Dysmenorrhea 09/14/2016  . Depression with anxiety 09/14/2016  . GERD (gastroesophageal reflux disease)     Conditions to be addressed/monitored per PCP order:  transportation  There are no care plans that you recently modified to display for this patient.   Follow up:  Patient requests no follow-up at this time.  Plan: The patient has been provided with contact information for the Managed Medicaid care management team and has been advised to call with any health related questions or concerns.    13/08/2016, BSW, 13/08/2016 [facility]  [place]  High Risk Managed Medicaid Team

## 2021-01-23 NOTE — Patient Instructions (Signed)
Visit Information  Cheryl Frank was given information about Medicaid Managed Care team care coordination services as a part of their [facility] Medicaid benefit. Cheryl Frank did not consent to engagement with the [facility] Managed Care team.   For questions related to your [facility] health plan, please call: [phone]  If you would like to schedule transportation through your [facility] plan, please call the following number at least 2 days in advance of your appointment: [phone]   Call the Behavioral Health Crisis Line at [phone], at any time, 24 hours a day, 7 days a week. If you are in danger or need immediate medical attention call 911.  Cheryl Frank - following are the goals we discussed in your visit today:  Goals Addressed   None      The patient has been provided with contact information for the Managed Medicaid care management team and has been advised to call with any health related questions or concerns.   Cheryl Frank, BSW, [place] [facility]  [place]  High Risk Managed Medicaid Team    Following is a copy of your plan of care:  There are no care plans to display for this patient.

## 2021-01-25 ENCOUNTER — Encounter: Payer: Self-pay | Admitting: Women's Health

## 2021-01-26 ENCOUNTER — Ambulatory Visit (INDEPENDENT_AMBULATORY_CARE_PROVIDER_SITE_OTHER): Payer: Medicaid Other | Admitting: *Deleted

## 2021-01-26 ENCOUNTER — Other Ambulatory Visit: Payer: Self-pay

## 2021-01-26 VITALS — BP 123/70 | HR 103

## 2021-01-26 DIAGNOSIS — Z8751 Personal history of pre-term labor: Secondary | ICD-10-CM | POA: Diagnosis not present

## 2021-01-26 NOTE — Progress Notes (Addendum)
   NURSE VISIT- INJECTION  SUBJECTIVE:  Cheryl Frank is a 20 y.o. G73P1102 female here for a Makena for history of preterm birth. She is [redacted]w[redacted]d pregnant.   OBJECTIVE:  BP 123/70   Pulse (!) 103   LMP 06/22/2020 (Approximate)   Appears well, in no apparent distress  Injection administered in: Left arm  No orders of the defined types were placed in this encounter.   ASSESSMENT: Pregnancy [redacted]w[redacted]d Makena for history of preterm birth PLAN: Follow-up: in 1 week for next Makena   Annamarie Dawley  01/26/2021 4:25 PM   Chart reviewed for nurse visit. Agree with plan of care.  Jacklyn Shell, [place] 01/26/2021 7:50 PM

## 2021-02-03 ENCOUNTER — Other Ambulatory Visit: Payer: Self-pay

## 2021-02-03 ENCOUNTER — Ambulatory Visit (INDEPENDENT_AMBULATORY_CARE_PROVIDER_SITE_OTHER): Payer: Medicaid Other

## 2021-02-03 ENCOUNTER — Encounter: Payer: Self-pay | Admitting: Women's Health

## 2021-02-03 ENCOUNTER — Ambulatory Visit (INDEPENDENT_AMBULATORY_CARE_PROVIDER_SITE_OTHER): Payer: Medicaid Other | Admitting: Women's Health

## 2021-02-03 VITALS — BP 96/60 | HR 90 | Wt 142.8 lb

## 2021-02-03 DIAGNOSIS — Z3A18 18 weeks gestation of pregnancy: Secondary | ICD-10-CM | POA: Diagnosis not present

## 2021-02-03 DIAGNOSIS — Z363 Encounter for antenatal screening for malformations: Secondary | ICD-10-CM | POA: Diagnosis not present

## 2021-02-03 DIAGNOSIS — Z3482 Encounter for supervision of other normal pregnancy, second trimester: Secondary | ICD-10-CM

## 2021-02-03 DIAGNOSIS — Z1379 Encounter for other screening for genetic and chromosomal anomalies: Secondary | ICD-10-CM | POA: Diagnosis not present

## 2021-02-03 DIAGNOSIS — Z3481 Encounter for supervision of other normal pregnancy, first trimester: Secondary | ICD-10-CM | POA: Diagnosis not present

## 2021-02-03 DIAGNOSIS — Z419 Encounter for procedure for purposes other than remedying health state, unspecified: Secondary | ICD-10-CM | POA: Diagnosis not present

## 2021-02-03 DIAGNOSIS — A549 Gonococcal infection, unspecified: Secondary | ICD-10-CM

## 2021-02-03 DIAGNOSIS — Z3480 Encounter for supervision of other normal pregnancy, unspecified trimester: Secondary | ICD-10-CM

## 2021-02-03 NOTE — Progress Notes (Signed)
[place] 18 wks,cephalic,posterior placenta gr 0,normal ovaries,cx 3.2 cm,svp of fluid 3.6 cm,fhr 144 bpm,efw 216 g 41%,anatomy complete,no obvious abnormalities

## 2021-02-03 NOTE — Progress Notes (Signed)
LOW-RISK PREGNANCY VISIT Patient name: Cheryl Frank MRN [id number]  Date of birth: 2001-02-18 Chief Complaint:   Routine Prenatal Visit, Injections, and Pregnancy Ultrasound  History of Present Illness:   Cheryl Frank is a 20 y.o. G25P1102 female at [redacted]w[redacted]d with an Estimated Date of Delivery: 07/07/21 being seen today for ongoing management of a low-risk pregnancy.  Depression screen [facility] 2/9 01/04/2021 10/31/2020 07/13/2020 12/30/2017 11/20/2017  Decreased Interest 1 1 1  0 0  Down, Depressed, Hopeless 2 1 2  0 0  PHQ - 2 Score 3 2 3  0 0  Altered sleeping 1 0 1 - -  Tired, decreased energy 3 1 2  - -  Change in appetite 1 1 2  - -  Feeling bad or failure about yourself  0 1 1 - -  Trouble concentrating 1 1 3  - -  Moving slowly or fidgety/restless 1 1 1  - -  Suicidal thoughts 0 0 0 - -  PHQ-9 Score 10 7 13  - -  Difficult doing work/chores - Somewhat difficult - - -    Today she reports no complaints. Contractions: Not present. Vag. Bleeding: None.  Movement: Present. denies leaking of fluid. Review of Systems:   Pertinent items are noted in HPI Denies abnormal vaginal discharge w/ itching/odor/irritation, headaches, visual changes, shortness of breath, chest pain, abdominal pain, severe nausea/vomiting, or problems with urination or bowel movements unless otherwise stated above. Pertinent History Reviewed:  Reviewed past medical,surgical, social, obstetrical and family history.  Reviewed problem list, medications and allergies. Physical Assessment:   Vitals:   02/03/21 1044  BP: 96/60  Pulse: 90  Weight: 142 lb 12.8 oz (64.8 kg)  Body mass index is 26.12 kg/m.        Physical Examination:   General appearance: Well appearing, and in no distress  Mental status: Alert, oriented to person, place, and time  Skin: Warm & dry  Cardiovascular: Normal heart rate noted  Respiratory: Normal respiratory effort, no distress  Abdomen: Soft, gravid, nontender  Pelvic: Cervical exam deferred          Extremities: Edema: Trace  Fetal Status: Fetal Heart Rate (bpm): 144 u/s   Movement: Present    18 wks,cephalic,posterior placenta gr 0,normal ovaries,cx 3.2 cm,svp of fluid 3.6 cm,fhr 144 bpm,efw 216 g 41%,anatomy complete,no obvious abnormalities    Chaperone: N/A   No results found for this or any previous visit (from the past 24 hour(s)).  Assessment & Plan:  1) Low-risk pregnancy G3P1102 at [redacted]w[redacted]d with an Estimated Date of Delivery: 07/07/21   2) H/O 33wk PTB, weekly Makena  3) Recent gonorrhea> POC today   Meds: No orders of the defined types were placed in this encounter.  Labs/procedures today: GC/CT, U/S and AFP  Plan:  Continue routine obstetrical care  Next visit: prefers in person    Reviewed: Preterm labor symptoms and general obstetric precautions including but not limited to vaginal bleeding, contractions, leaking of fluid and fetal movement were reviewed in detail with the patient.  All questions were answered. Does have home bp cuff. Office bp cuff given: not applicable. Check bp weekly, let know if consistently >140 and/or >90  .  Follow-up: Return in about 4 weeks (around 03/03/2021) for LROB, CNM.  Future Appointments  Date Time Provider Department Center  02/09/2021  3:10 PM [facility] NURSE [facility]  02/16/2021  3:30 PM [facility] NURSE [facility]  02/23/2021  3:30 PM [facility] NURSE [facility]  03/02/2021  3:30 PM [facility]  NURSE [facility]  03/09/2021  3:30 PM [facility] NURSE [facility]  03/16/2021  3:10 PM [facility] NURSE [facility]  03/23/2021  3:30 PM [facility] NURSE [facility]  03/30/2021  3:10 PM [facility] NURSE [facility]  04/06/2021  3:30 PM [facility]URSE [facility]  04/13/2021  3:30 PM [facility]URSE [facility]  04/20/2021  3:30 PM [facility] NURSE [facility]  04/27/2021  3:30 PM [facility] NURSE [facility]  05/01/2021  3:35 PM Gwenlyn Fudge, FNP [facility] None  05/04/2021  3:30  PM [facility] NURSE [facility]  05/11/2021  3:30 PM [facility] NURSE [facility]  05/18/2021  3:30 PM [facility]URSE [facility]  05/25/2021  3:30 PM [facility]URSE [facility]  06/01/2021  3:30 PM [facility]URSE [facility]  06/08/2021  3:30 PM [facility]URSE [facility]  06/15/2021  3:30 PM [facility]URSE [facility]  06/29/2021  3:30 PM [facility]SE [facility]    Orders Placed This Encounter  Procedures  . GC/Chlamydia Probe Amp  . AFP, Serum, Open Spina Bifida   [name] CNM, [facility] 02/03/2021 11:33 AM

## 2021-02-03 NOTE — Patient Instructions (Signed)
Cheryl Frank, I greatly value your feedback.  If you receive a survey following your visit with [place] today, we appreciate you taking the time to fill it out.  Thanks, Joellyn Haff, CNM, WHNP-BC  [facility] ([address] [place], [place] [address]) Entrance C, located off of [address] valet parking  Go to [email or web address] to register for FREE online childbirth classes   Pediatricians/Family Doctors:  [facility] [phone]            [facility] [phone]                 [facility] [phone] (usually not accepting new patients unless you have family there already, you are always welcome to call and ask)       [facility] Department [phone]       [facility] Pediatricians/Family Doctors:   [facility]: [phone]  [facility]: [phone]  [facility]: [phone]  [facility] Doctors:   [facility]: [phone]   [facility]: [phone]  [facility] Doctors:  [facility]: [phone]    Home Blood Pressure Monitoring for Patients   Your provider has recommended that you check your blood pressure (BP) at least once a week at home. If you do not have a blood pressure cuff at home, one will be provided for you. Contact your provider if you have not received your monitor within 1 week.   Helpful Tips for Accurate Home Blood Pressure Checks  . Don't smoke, exercise, or drink caffeine 30 minutes before checking your BP . Use the restroom before checking your BP (a full bladder can raise your pressure) . Relax in a comfortable upright chair . Feet on the ground . Left arm resting comfortably on a flat surface at the level of your heart . Legs uncrossed . Back supported . Sit quietly and don't talk . Place the cuff on your bare arm . Adjust snuggly, so  that only two fingertips can fit between your skin and the top of the cuff . Check 2 readings separated by at least one minute . Keep a log of your BP readings . For a visual, please reference this diagram: http://ccnc.care/bpdiagram  Provider Name: [facility]     Phone: [phone]  Zone 1: ALL CLEAR  Continue to monitor your symptoms:  . BP reading is less than 140 (top number) or less than 90 (bottom number)  . No right upper stomach pain . No headaches or seeing spots . No feeling nauseated or throwing up . No swelling in face and hands  Zone 2: CAUTION Call your doctor's office for any of the following:  . BP reading is greater than 140 (top number) or greater than 90 (bottom number)  . Stomach pain under your ribs in the middle or right side . Headaches or seeing spots . Feeling nauseated or throwing up . Swelling in face and hands  Zone 3: EMERGENCY  Seek immediate medical care if you have any of the following:  . BP reading is greater than160 (top number) or greater than 110 (bottom number) . Severe headaches not improving with Tylenol . Serious difficulty catching your breath . Any worsening symptoms from Zone 2     Second Trimester of Pregnancy The second trimester is from week 14 through week 27 (months 4 through 6). The second trimester is often a time when you feel your  best. Your body has adjusted to being pregnant, and you begin to feel better physically. Usually, morning sickness has lessened or quit completely, you may have more energy, and you may have an increase in appetite. The second trimester is also a time when the fetus is growing rapidly. At the end of the sixth month, the fetus is about 9 inches long and weighs about 1 pounds. You will likely begin to feel the baby move (quickening) between 16 and 20 weeks of pregnancy. Body changes during your second trimester Your body continues to go through many changes during your second trimester. The  changes vary from woman to woman.  Your weight will continue to increase. You will notice your lower abdomen bulging out.  You may begin to get stretch marks on your hips, abdomen, and breasts.  You may develop headaches that can be relieved by medicines. The medicines should be approved by your health care provider.  You may urinate more often because the fetus is pressing on your bladder.  You may develop or continue to have heartburn as a result of your pregnancy.  You may develop constipation because certain hormones are causing the muscles that push waste through your intestines to slow down.  You may develop hemorrhoids or swollen, bulging veins (varicose veins).  You may have back pain. This is caused by: ? Weight gain. ? Pregnancy hormones that are relaxing the joints in your pelvis. ? A shift in weight and the muscles that support your balance.  Your breasts will continue to grow and they will continue to become tender.  Your gums may bleed and may be sensitive to brushing and flossing.  Dark spots or blotches (chloasma, mask of pregnancy) may develop on your face. This will likely fade after the baby is born.  A dark line from your belly button to the pubic area (linea nigra) may appear. This will likely fade after the baby is born.  You may have changes in your hair. These can include thickening of your hair, rapid growth, and changes in texture. Some women also have hair loss during or after pregnancy, or hair that feels dry or thin. Your hair will most likely return to normal after your baby is born.  What to expect at prenatal visits During a routine prenatal visit:  You will be weighed to make sure you and the fetus are growing normally.  Your blood pressure will be taken.  Your abdomen will be measured to track your baby's growth.  The fetal heartbeat will be listened to.  Any test results from the previous visit will be discussed.  Your health care  provider may ask you:  How you are feeling.  If you are feeling the baby move.  If you have had any abnormal symptoms, such as leaking fluid, bleeding, severe headaches, or abdominal cramping.  If you are using any tobacco products, including cigarettes, chewing tobacco, and electronic cigarettes.  If you have any questions.  Other tests that may be performed during your second trimester include:  Blood tests that check for: ? Low iron levels (anemia). ? High blood sugar that affects pregnant women (gestational diabetes) between 71 and 28 weeks. ? Rh antibodies. This is to check for a protein on red blood cells (Rh factor).  Urine tests to check for infections, diabetes, or protein in the urine.  An ultrasound to confirm the proper growth and development of the baby.  An amniocentesis to check for possible genetic problems.  Fetal  screens for spina bifida and Down syndrome.  HIV (human immunodeficiency virus) testing. Routine prenatal testing includes screening for HIV, unless you choose not to have this test.  Follow these instructions at home: Medicines  Follow your health care provider's instructions regarding medicine use. Specific medicines may be either safe or unsafe to take during pregnancy.  Take a prenatal vitamin that contains at least 600 micrograms (mcg) of folic acid.  If you develop constipation, try taking a stool softener if your health care provider approves. Eating and drinking  Eat a balanced diet that includes fresh fruits and vegetables, whole grains, good sources of protein such as meat, eggs, or tofu, and low-fat dairy. Your health care provider will help you determine the amount of weight gain that is right for you.  Avoid raw meat and uncooked cheese. These carry germs that can cause birth defects in the baby.  If you have low calcium intake from food, talk to your health care provider about whether you should take a daily calcium  supplement.  Limit foods that are high in fat and processed sugars, such as fried and sweet foods.  To prevent constipation: ? Drink enough fluid to keep your urine clear or pale yellow. ? Eat foods that are high in fiber, such as fresh fruits and vegetables, whole grains, and beans. Activity  Exercise only as directed by your health care provider. Most women can continue their usual exercise routine during pregnancy. Try to exercise for 30 minutes at least 5 days a week. Stop exercising if you experience uterine contractions.  Avoid heavy lifting, wear low heel shoes, and practice good posture.  A sexual relationship may be continued unless your health care provider directs you otherwise. Relieving pain and discomfort  Wear a good support bra to prevent discomfort from breast tenderness.  Take warm sitz baths to soothe any pain or discomfort caused by hemorrhoids. Use hemorrhoid cream if your health care provider approves.  Rest with your legs elevated if you have leg cramps or low back pain.  If you develop varicose veins, wear support hose. Elevate your feet for 15 minutes, 3-4 times a day. Limit salt in your diet. Prenatal Care  Write down your questions. Take them to your prenatal visits.  Keep all your prenatal visits as told by your health care provider. This is important. Safety  Wear your seat belt at all times when driving.  Make a list of emergency phone numbers, including numbers for family, friends, the hospital, and police and fire departments. General instructions  Ask your health care provider for a referral to a local prenatal education class. Begin classes no later than the beginning of month 6 of your pregnancy.  Ask for help if you have counseling or nutritional needs during pregnancy. Your health care provider can offer advice or refer you to specialists for help with various needs.  Do not use hot tubs, steam rooms, or saunas.  Do not douche or use  tampons or scented sanitary pads.  Do not cross your legs for long periods of time.  Avoid cat litter boxes and soil used by cats. These carry germs that can cause birth defects in the baby and possibly loss of the fetus by miscarriage or stillbirth.  Avoid all smoking, herbs, alcohol, and unprescribed drugs. Chemicals in these products can affect the formation and growth of the baby.  Do not use any products that contain nicotine or tobacco, such as cigarettes and e-cigarettes. If you need help  quitting, ask your health care provider.  Visit your dentist if you have not gone yet during your pregnancy. Use a soft toothbrush to brush your teeth and be gentle when you floss. Contact a health care provider if:  You have dizziness.  You have mild pelvic cramps, pelvic pressure, or nagging pain in the abdominal area.  You have persistent nausea, vomiting, or diarrhea.  You have a bad smelling vaginal discharge.  You have pain when you urinate. Get help right away if:  You have a fever.  You are leaking fluid from your vagina.  You have spotting or bleeding from your vagina.  You have severe abdominal cramping or pain.  You have rapid weight gain or weight loss.  You have shortness of breath with chest pain.  You notice sudden or extreme swelling of your face, hands, ankles, feet, or legs.  You have not felt your baby move in over an hour.  You have severe headaches that do not go away when you take medicine.  You have vision changes. Summary  The second trimester is from week 14 through week 27 (months 4 through 6). It is also a time when the fetus is growing rapidly.  Your body goes through many changes during pregnancy. The changes vary from woman to woman.  Avoid all smoking, herbs, alcohol, and unprescribed drugs. These chemicals affect the formation and growth your baby.  Do not use any tobacco products, such as cigarettes, chewing tobacco, and e-cigarettes. If you  need help quitting, ask your health care provider.  Contact your health care provider if you have any questions. Keep all prenatal visits as told by your health care provider. This is important. This information is not intended to replace advice given to you by your health care provider. Make sure you discuss any questions you have with your health care provider. Document Released: 10/16/2001 Document Revised: 03/29/2016 Document Reviewed: 12/23/2012 Elsevier Interactive Patient Education  2017 [employer]   

## 2021-02-04 LAB — GC/CHLAMYDIA PROBE AMP
Chlamydia trachomatis, NAA: NEGATIVE
Neisseria Gonorrhoeae by PCR: NEGATIVE

## 2021-02-05 LAB — AFP, SERUM, OPEN SPINA BIFIDA
AFP MoM: 1
AFP Value: 47.1 ng/mL
Gest. Age on Collection Date: 18 weeks
Maternal Age At EDD: 22.3 yr
OSBR Risk 1 IN: 10000
Test Results:: NEGATIVE
Weight: 143 [lb_av]

## 2021-02-07 ENCOUNTER — Other Ambulatory Visit: Payer: Medicaid Other

## 2021-02-09 ENCOUNTER — Ambulatory Visit: Payer: Medicaid Other

## 2021-02-10 ENCOUNTER — Other Ambulatory Visit: Payer: Self-pay

## 2021-02-16 ENCOUNTER — Ambulatory Visit: Payer: Medicaid Other

## 2021-02-23 ENCOUNTER — Ambulatory Visit: Payer: Medicaid Other

## 2021-03-02 ENCOUNTER — Ambulatory Visit (INDEPENDENT_AMBULATORY_CARE_PROVIDER_SITE_OTHER): Payer: Self-pay | Admitting: Women's Health

## 2021-03-02 ENCOUNTER — Ambulatory Visit: Payer: Medicaid Other

## 2021-03-02 ENCOUNTER — Other Ambulatory Visit: Payer: Self-pay

## 2021-03-02 ENCOUNTER — Encounter: Payer: Self-pay | Admitting: Women's Health

## 2021-03-02 VITALS — BP 114/71 | HR 98 | Wt 146.0 lb

## 2021-03-02 DIAGNOSIS — Z3480 Encounter for supervision of other normal pregnancy, unspecified trimester: Secondary | ICD-10-CM

## 2021-03-02 DIAGNOSIS — Z8751 Personal history of pre-term labor: Secondary | ICD-10-CM

## 2021-03-02 DIAGNOSIS — Z3482 Encounter for supervision of other normal pregnancy, second trimester: Secondary | ICD-10-CM

## 2021-03-02 MED ORDER — PROGESTERONE 200 MG PO CAPS: 200.0000 mg | ORAL_CAPSULE | Freq: Every day | ORAL | 5 refills | Status: DC

## 2021-03-02 NOTE — Progress Notes (Signed)
LOW-RISK PREGNANCY VISIT Patient name: Cheryl Frank MRN [id number]  Date of birth: April 10, 2001 Chief Complaint:   Routine Prenatal Visit (Does not want Makena anymore; low back pain)  History of Present Illness:   Cheryl Frank is a 20 y.o. G70P1102 female at [redacted]w[redacted]d with an Estimated Date of Delivery: 07/07/21 being seen today for ongoing management of a low-risk pregnancy.  Depression screen [facility] 2/9 01/04/2021 10/31/2020 07/13/2020 12/30/2017 11/20/2017  Decreased Interest 1 1 1  0 0  Down, Depressed, Hopeless 2 1 2  0 0  PHQ - 2 Score 3 2 3  0 0  Altered sleeping 1 0 1 - -  Tired, decreased energy 3 1 2  - -  Change in appetite 1 1 2  - -  Feeling bad or failure about yourself  0 1 1 - -  Trouble concentrating 1 1 3  - -  Moving slowly or fidgety/restless 1 1 1  - -  Suicidal thoughts 0 0 0 - -  PHQ-9 Score 10 7 13  - -  Difficult doing work/chores - Somewhat difficult - - -    Today she reports day after she receives Makena has dizzines x 2 days, and bilateral arm weakness/heaviness x 1d. Does not want anymore. Discussed prometrium- wants this. Low back pain. Contractions: Not present. Vag. Bleeding: None.  Movement: Present. denies leaking of fluid. Review of Systems:   Pertinent items are noted in HPI Denies abnormal vaginal discharge w/ itching/odor/irritation, headaches, visual changes, shortness of breath, chest pain, abdominal pain, severe nausea/vomiting, or problems with urination or bowel movements unless otherwise stated above. Pertinent History Reviewed:  Reviewed past medical,surgical, social, obstetrical and family history.  Reviewed problem list, medications and allergies. Physical Assessment:   Vitals:   03/02/21 1612  BP: 114/71  Pulse: 98  Weight: 146 lb (66.2 kg)  Body mass index is 26.7 kg/m.        Physical Examination:   General appearance: Well appearing, and in no distress  Mental status: Alert, oriented to person, place, and time  Skin: Warm &  dry  Cardiovascular: Normal heart rate noted  Respiratory: Normal respiratory effort, no distress  Abdomen: Soft, gravid, nontender  Pelvic: Cervical exam deferred         Extremities: Edema: Trace  Fetal Status: Fetal Heart Rate (bpm): 144 Fundal Height: 21 cm Movement: Present    Chaperone: N/A   No results found for this or any previous visit (from the past 24 hour(s)).  Assessment & Plan:  1) Low-risk pregnancy G3P1102 at [redacted]w[redacted]d with an Estimated Date of Delivery: 07/07/21   2) Low back pain, discussed relief measures  3) H/O 33wk PPROM/PTB> side effects w/ Makena, wants prometrium. Rx sent   Meds:  Meds ordered this encounter  Medications  . progesterone (PROMETRIUM) 200 MG capsule    Sig: Place 1 capsule (200 mg total) vaginally at bedtime.    Dispense:  30 capsule    Refill:  5    Order Specific Question:   Supervising Provider    Answer:   [[id number]]   Labs/procedures today: none  Plan:  Continue routine obstetrical care  Next visit: prefers will be in person for pn2    Reviewed: Preterm labor symptoms and general obstetric precautions including but not limited to vaginal bleeding, contractions, leaking of fluid and fetal movement were reviewed in detail with the patient.  All questions were answered. Does have home bp cuff. Office bp cuff given: not applicable. Check bp weekly, let  us know if consistently >140 and/or >90.  Follow-up: Return in about 29 days (around 03/31/2021) for LROB, PN2, in person, CNM.  Future Appointments  Date Time Provider Department Center  03/31/2021  8:30 AM [facility] LAB [facility]  03/31/2021  8:50 AM [name], CNM [facility]  05/01/2021  3:35 PM Gwenlyn Fudge, FNP [facility] None    No orders of the defined types were placed in this encounter.  [name] CNM, [facility] 03/02/2021 4:38 PM

## 2021-03-02 NOTE — Patient Instructions (Signed)
Cheryl Frank, I greatly value your feedback.  If you receive a survey following your visit with [place] today, we appreciate you taking the time to fill it out.  Thanks, Joellyn Haff, CNM, WHNP-BC   You will have your sugar test next visit.  Please do not eat or drink anything after midnight the night before you come, not even water.  You will be here for at least two hours.  Please make an appointment online for the bloodwork at [email or web address] for 8:30am (or as close to this as possible). Make sure you select the [facility] service center. The day of the appointment, check in with our office first, then you will go to [facility] to start the sugar test.    [facility] [place], [place] [address]) Entrance C, located off of [address] valet parking  Go to [email or web address] to register for FREE online childbirth classes   Call the office [phone]) or go to [facility] if:  You begin to have strong, frequent contractions  Your water breaks.  Sometimes it is a big gush of fluid, sometimes it is just a trickle that keeps getting your panties wet or running down your legs  You have vaginal bleeding.  It is normal to have a small amount of spotting if your cervix was checked.   You don't feel your baby moving like normal.  If you don't, get you something to eat and drink and lay down and focus on feeling your baby move.   If your baby is still not moving like normal, you should call the office or go to [facility].  [place] Pediatricians/Family Doctors:  [facility] [phone]            [facility] [phone]                 [facility] [phone] (usually not accepting new patients unless you have family there already, you are always welcome to call and ask)       [facility] Department [phone]       [facility] Pediatricians/Family Doctors:   [facility]:  [phone]  [facility]: [phone]  [facility]: [phone]  [facility] Doctors:   [facility]: [phone]   [facility]: [phone]  [facility] Doctors:  [facility]: [phone]   Home Blood Pressure Monitoring for Patients   Your provider has recommended that you check your blood pressure (BP) at least once a week at home. If you do not have a blood pressure cuff at home, one will be provided for you. Contact your provider if you have not received your monitor within 1 week.   Helpful Tips for Accurate Home Blood Pressure Checks  . Don't smoke, exercise, or drink caffeine 30 minutes before checking your BP . Use the restroom before checking your BP (a full bladder can raise your pressure) . Relax in a comfortable upright chair . Feet on the ground . Left arm resting comfortably on a flat surface at the level of your heart . Legs uncrossed . Back supported . Sit quietly and don't talk . Place the cuff on your bare arm . Adjust snuggly, so that only two fingertips can fit between your skin and the top of the cuff . Check 2 readings separated by at least one minute . Keep a log of your BP readings . For a visual, please  reference this diagram: http://ccnc.care/bpdiagram  Provider Name: [facility]     Phone: [phone]  Zone 1: ALL CLEAR  Continue to monitor your symptoms:  . BP reading is less than 140 (top number) or less than 90 (bottom number)  . No right upper stomach pain . No headaches or seeing spots . No feeling nauseated or throwing up . No swelling in face and hands  Zone 2: CAUTION Call your doctor's office for any of the following:  . BP reading is greater than 140 (top number) or greater than 90 (bottom number)  . Stomach pain under your ribs in the middle or right side . Headaches or seeing spots . Feeling nauseated or throwing up . Swelling in  face and hands  Zone 3: EMERGENCY  Seek immediate medical care if you have any of the following:  . BP reading is greater than160 (top number) or greater than 110 (bottom number) . Severe headaches not improving with Tylenol . Serious difficulty catching your breath . Any worsening symptoms from Zone 2   Second Trimester of Pregnancy The second trimester is from week 13 through week 28, months 4 through 6. The second trimester is often a time when you feel your best. Your body has also adjusted to being pregnant, and you begin to feel better physically. Usually, morning sickness has lessened or quit completely, you may have more energy, and you may have an increase in appetite. The second trimester is also a time when the fetus is growing rapidly. At the end of the sixth month, the fetus is about 9 inches long and weighs about 1 pounds. You will likely begin to feel the baby move (quickening) between 18 and 20 weeks of the pregnancy. BODY CHANGES Your body goes through many changes during pregnancy. The changes vary from woman to woman.   Your weight will continue to increase. You will notice your lower abdomen bulging out.  You may begin to get stretch marks on your hips, abdomen, and breasts.  You may develop headaches that can be relieved by medicines approved by your health care provider.  You may urinate more often because the fetus is pressing on your bladder.  You may develop or continue to have heartburn as a result of your pregnancy.  You may develop constipation because certain hormones are causing the muscles that push waste through your intestines to slow down.  You may develop hemorrhoids or swollen, bulging veins (varicose veins).  You may have back pain because of the weight gain and pregnancy hormones relaxing your joints between the bones in your pelvis and as a result of a shift in weight and the muscles that support your balance.  Your breasts will continue to grow  and be tender.  Your gums may bleed and may be sensitive to brushing and flossing.  Dark spots or blotches (chloasma, mask of pregnancy) may develop on your face. This will likely fade after the baby is born.  A dark line from your belly button to the pubic area (linea nigra) may appear. This will likely fade after the baby is born.  You may have changes in your hair. These can include thickening of your hair, rapid growth, and changes in texture. Some women also have hair loss during or after pregnancy, or hair that feels dry or thin. Your hair will most likely return to normal after your baby is born. WHAT TO EXPECT AT YOUR PRENATAL VISITS During a routine prenatal visit:  You  will be weighed to make sure you and the fetus are growing normally.  Your blood pressure will be taken.  Your abdomen will be measured to track your baby's growth.  The fetal heartbeat will be listened to.  Any test results from the previous visit will be discussed. Your health care provider may ask you:  How you are feeling.  If you are feeling the baby move.  If you have had any abnormal symptoms, such as leaking fluid, bleeding, severe headaches, or abdominal cramping.  If you have any questions. Other tests that may be performed during your second trimester include:  Blood tests that check for:  Low iron levels (anemia).  Gestational diabetes (between 24 and 28 weeks).  Rh antibodies.  Urine tests to check for infections, diabetes, or protein in the urine.  An ultrasound to confirm the proper growth and development of the baby.  An amniocentesis to check for possible genetic problems.  Fetal screens for spina bifida and Down syndrome. HOME CARE INSTRUCTIONS   Avoid all smoking, herbs, alcohol, and unprescribed drugs. These chemicals affect the formation and growth of the baby.  Follow your health care provider's instructions regarding medicine use. There are medicines that are either  safe or unsafe to take during pregnancy.  Exercise only as directed by your health care provider. Experiencing uterine cramps is a good sign to stop exercising.  Continue to eat regular, healthy meals.  Wear a good support bra for breast tenderness.  Do not use hot tubs, steam rooms, or saunas.  Wear your seat belt at all times when driving.  Avoid raw meat, uncooked cheese, cat litter boxes, and soil used by cats. These carry germs that can cause birth defects in the baby.  Take your prenatal vitamins.  Try taking a stool softener (if your health care provider approves) if you develop constipation. Eat more high-fiber foods, such as fresh vegetables or fruit and whole grains. Drink plenty of fluids to keep your urine clear or pale yellow.  Take warm sitz baths to soothe any pain or discomfort caused by hemorrhoids. Use hemorrhoid cream if your health care provider approves.  If you develop varicose veins, wear support hose. Elevate your feet for 15 minutes, 3-4 times a day. Limit salt in your diet.  Avoid heavy lifting, wear low heel shoes, and practice good posture.  Rest with your legs elevated if you have leg cramps or low back pain.  Visit your dentist if you have not gone yet during your pregnancy. Use a soft toothbrush to brush your teeth and be gentle when you floss.  A sexual relationship may be continued unless your health care provider directs you otherwise.  Continue to go to all your prenatal visits as directed by your health care provider. SEEK MEDICAL CARE IF:   You have dizziness.  You have mild pelvic cramps, pelvic pressure, or nagging pain in the abdominal area.  You have persistent nausea, vomiting, or diarrhea.  You have a bad smelling vaginal discharge.  You have pain with urination. SEEK IMMEDIATE MEDICAL CARE IF:   You have a fever.  You are leaking fluid from your vagina.  You have spotting or bleeding from your vagina.  You have severe  abdominal cramping or pain.  You have rapid weight gain or loss.  You have shortness of breath with chest pain.  You notice sudden or extreme swelling of your face, hands, ankles, feet, or legs.  You have not felt your baby move  in over an hour.  You have severe headaches that do not go away with medicine.  You have vision changes. Document Released: 10/16/2001 Document Revised: 10/27/2013 Document Reviewed: 12/23/2012 [facility] Patient Information 2015 [place], [place]. This information is not intended to replace advice given to you by your health care provider. Make sure you discuss any questions you have with your health care provider.

## 2021-03-05 DIAGNOSIS — Z419 Encounter for procedure for purposes other than remedying health state, unspecified: Secondary | ICD-10-CM | POA: Diagnosis not present

## 2021-03-09 ENCOUNTER — Ambulatory Visit: Payer: Medicaid Other

## 2021-03-16 ENCOUNTER — Ambulatory Visit: Payer: Medicaid Other

## 2021-03-21 ENCOUNTER — Encounter: Payer: Self-pay | Admitting: *Deleted

## 2021-03-23 ENCOUNTER — Ambulatory Visit: Payer: Medicaid Other

## 2021-03-29 ENCOUNTER — Telehealth: Payer: Self-pay | Admitting: Women's Health

## 2021-03-29 NOTE — Telephone Encounter (Signed)
Pt's having painful leg spasms, mostly at night  What can she do for this?  Can we prescribe anything?   [facility]

## 2021-03-30 ENCOUNTER — Ambulatory Visit: Payer: Medicaid Other

## 2021-03-30 NOTE — Telephone Encounter (Signed)
Unable to reach patient.

## 2021-03-31 ENCOUNTER — Other Ambulatory Visit: Payer: Self-pay

## 2021-03-31 ENCOUNTER — Encounter: Payer: Self-pay | Admitting: Women's Health

## 2021-04-05 DIAGNOSIS — Z419 Encounter for procedure for purposes other than remedying health state, unspecified: Secondary | ICD-10-CM | POA: Diagnosis not present

## 2021-04-06 ENCOUNTER — Ambulatory Visit: Payer: Medicaid Other

## 2021-04-07 ENCOUNTER — Other Ambulatory Visit: Payer: Self-pay

## 2021-04-07 ENCOUNTER — Encounter: Payer: Self-pay | Admitting: Obstetrics & Gynecology

## 2021-04-08 DIAGNOSIS — J45909 Unspecified asthma, uncomplicated: Secondary | ICD-10-CM

## 2021-04-08 HISTORY — DX: Unspecified asthma, uncomplicated: J45.909

## 2021-04-12 ENCOUNTER — Telehealth: Payer: Self-pay | Admitting: Family Medicine

## 2021-04-12 NOTE — Telephone Encounter (Signed)
..   Medicaid Managed Care   Unsuccessful Outreach Note  04/12/2021 Name: Cheryl Frank MRN: [id number] DOB: July 04, 2001  Referred by: Gwenlyn Fudge, FNP Reason for referral : High Risk Managed Medicaid (Tried to reach [name] today to get her scheduled with the [facility] team for a phone visit. The VM message did not match the patients name so I did not leave a message.)   An unsuccessful telephone outreach was attempted today. The patient was referred to the case management team for assistance with care management and care coordination.   Follow Up Plan: The care management team will reach out to the patient again over the next 7-14 days.   Weston Settle Care Guide, High Risk Medicaid Managed Care Embedded Care Coordination [facility]  [facility]

## 2021-04-13 ENCOUNTER — Ambulatory Visit: Payer: Medicaid Other

## 2021-04-18 ENCOUNTER — Telehealth: Payer: Medicaid Other | Admitting: Women's Health

## 2021-04-19 ENCOUNTER — Encounter: Payer: Medicaid Other | Admitting: Advanced Practice Midwife

## 2021-04-20 ENCOUNTER — Ambulatory Visit: Payer: Medicaid Other

## 2021-04-27 ENCOUNTER — Ambulatory Visit: Payer: Medicaid Other

## 2021-05-01 ENCOUNTER — Other Ambulatory Visit: Payer: Self-pay

## 2021-05-01 ENCOUNTER — Encounter: Payer: Self-pay | Admitting: Family Medicine

## 2021-05-01 ENCOUNTER — Ambulatory Visit (INDEPENDENT_AMBULATORY_CARE_PROVIDER_SITE_OTHER): Payer: Medicaid Other | Admitting: Family Medicine

## 2021-05-01 DIAGNOSIS — J4530 Mild persistent asthma, uncomplicated: Secondary | ICD-10-CM

## 2021-05-01 DIAGNOSIS — R252 Cramp and spasm: Secondary | ICD-10-CM

## 2021-05-01 DIAGNOSIS — O99891 Other specified diseases and conditions complicating pregnancy: Secondary | ICD-10-CM | POA: Diagnosis not present

## 2021-05-01 DIAGNOSIS — F418 Other specified anxiety disorders: Secondary | ICD-10-CM | POA: Diagnosis not present

## 2021-05-01 NOTE — Progress Notes (Signed)
Virtual Visit via Telephone Note  I connected with Cheryl Frank on 05/01/21 at 3:07 PM by telephone and verified that I am speaking with the correct person using two identifiers. Cheryl Frank is currently located at home and nobody is currently with her during this visit. The provider, Gwenlyn Fudge, FNP is located in their home at time of visit.  I discussed the limitations, risks, security and privacy concerns of performing an evaluation and management service by telephone and the availability of in person appointments. I also discussed with the patient that there may be a patient responsible charge related to this service. The patient expressed understanding and agreed to proceed.  Subjective: PCP: Gwenlyn Fudge, FNP  Chief Complaint  Patient presents with   Medical Management of Chronic Issues   Depression/anxiety: Currently being managed by her OB/GYN since she is pregnant.  She was switched over to Celexa and is currently on 20 mg once daily.  She feels like it is working somewhat.  Asthma: Patient does report shortness of breath at times.  She is using her Flovent most days.  She does sometimes have to use her albuterol which she reports is somewhat effective.  She is [redacted] weeks pregnant and weather where the temperature is getting close to 100 degrees.  Her biggest complaint is that she has been having a lot of leg cramping and issues with her veins.  She has tried putting a bar of soap under her mattress which did not help.   ROS: Per HPI  Current Outpatient Medications:    albuterol (VENTOLIN HFA) 108 (90 Base) MCG/ACT inhaler, Inhale 2 puffs into the lungs every 6 (six) hours as needed., Disp: 18 g, Rfl: 2   citalopram (CELEXA) 20 MG tablet, Take 10mg  (1/2 tablet) daily x 1 week, then 20mg  (1 tablet) daily thereafter, Disp: 30 tablet, Rfl: 2   fluticasone (FLOVENT DISKUS) 50 MCG/BLIST diskus inhaler, Inhale 1 puff into the lungs 2 (two) times daily., Disp: 1 each, Rfl:  12   Prenat w/o A-FeCbGl-DSS-FA-DHA (CITRANATAL ASSURE) 35-1 & 300 MG tablet, One tablet and one capsule daily, Disp: 90 each, Rfl: 3   progesterone (PROMETRIUM) 200 MG capsule, Place 1 capsule (200 mg total) vaginally at bedtime., Disp: 30 capsule, Rfl: 5   promethazine (PHENERGAN) 25 MG tablet, Take 0.5-1 tablets (12.5-25 mg total) by mouth every 6 (six) hours as needed for nausea or vomiting., Disp: 30 tablet, Rfl: 0  Allergies  Allergen Reactions   Penicillins Anaphylaxis    Throat swells   Past Medical History:  Diagnosis Date   Anxiety    Depression    GERD (gastroesophageal reflux disease)    Gonorrhea 01/2021    Observations/Objective: A&O  No respiratory distress or wheezing audible over the phone Mood, judgement, and thought processes all WNL   Assessment and Plan: 1. Depression with anxiety Advised to discuss a potential dose increase of Celexa with her OB.  2. Mild persistent asthma without complication Encouraged to use Flovent daily.  3. Leg cramps in pregnancy Encourage patient to wear compression hose daily and elevate her legs is much as she can.  May use [place] leg cramp ointment.   Follow Up Instructions: Return in about 6 months (around 10/31/2021) for annual physical.  I discussed the assessment and treatment plan with the patient. The patient was provided an opportunity to ask questions and all were answered. The patient agreed with the plan and demonstrated an understanding of the instructions.  The patient was advised to call back or seek an in-person evaluation if the symptoms worsen or if the condition fails to improve as anticipated.  The above assessment and management plan was discussed with the patient. The patient verbalized understanding of and has agreed to the management plan. Patient is aware to call the clinic if symptoms persist or worsen. Patient is aware when to return to the clinic for a follow-up visit. Patient educated on when it  is appropriate to go to the emergency department.   Time call ended: 3:20 PM  I provided 13 minutes of non-face-to-face time during this encounter.  Deliah Boston, MSN, APRN, FNP-C [facility] 05/01/21

## 2021-05-04 ENCOUNTER — Ambulatory Visit: Payer: Medicaid Other

## 2021-05-05 DIAGNOSIS — Z419 Encounter for procedure for purposes other than remedying health state, unspecified: Secondary | ICD-10-CM | POA: Diagnosis not present

## 2021-05-11 ENCOUNTER — Ambulatory Visit: Payer: Medicaid Other

## 2021-05-15 ENCOUNTER — Encounter: Payer: Medicaid Other | Admitting: Obstetrics & Gynecology

## 2021-05-18 ENCOUNTER — Ambulatory Visit: Payer: Medicaid Other

## 2021-05-18 ENCOUNTER — Other Ambulatory Visit: Payer: Self-pay | Admitting: Women's Health

## 2021-05-18 DIAGNOSIS — Z3481 Encounter for supervision of other normal pregnancy, first trimester: Secondary | ICD-10-CM

## 2021-05-23 DIAGNOSIS — Z3A33 33 weeks gestation of pregnancy: Secondary | ICD-10-CM | POA: Diagnosis not present

## 2021-05-23 DIAGNOSIS — O09213 Supervision of pregnancy with history of pre-term labor, third trimester: Secondary | ICD-10-CM | POA: Diagnosis not present

## 2021-05-23 DIAGNOSIS — O98513 Other viral diseases complicating pregnancy, third trimester: Secondary | ICD-10-CM | POA: Diagnosis not present

## 2021-05-23 DIAGNOSIS — J45909 Unspecified asthma, uncomplicated: Secondary | ICD-10-CM | POA: Diagnosis not present

## 2021-05-23 DIAGNOSIS — Z8759 Personal history of other complications of pregnancy, childbirth and the puerperium: Secondary | ICD-10-CM | POA: Diagnosis not present

## 2021-05-23 DIAGNOSIS — O99513 Diseases of the respiratory system complicating pregnancy, third trimester: Secondary | ICD-10-CM | POA: Diagnosis not present

## 2021-05-23 DIAGNOSIS — Z880 Allergy status to penicillin: Secondary | ICD-10-CM | POA: Diagnosis not present

## 2021-05-23 DIAGNOSIS — U071 COVID-19: Secondary | ICD-10-CM | POA: Diagnosis not present

## 2021-05-23 DIAGNOSIS — O0933 Supervision of pregnancy with insufficient antenatal care, third trimester: Secondary | ICD-10-CM | POA: Diagnosis not present

## 2021-05-23 DIAGNOSIS — O99343 Other mental disorders complicating pregnancy, third trimester: Secondary | ICD-10-CM | POA: Diagnosis not present

## 2021-05-23 DIAGNOSIS — F418 Other specified anxiety disorders: Secondary | ICD-10-CM | POA: Diagnosis not present

## 2021-05-24 ENCOUNTER — Telehealth: Payer: Self-pay

## 2021-05-24 ENCOUNTER — Encounter: Payer: Medicaid Other | Admitting: Advanced Practice Midwife

## 2021-05-24 DIAGNOSIS — O99343 Other mental disorders complicating pregnancy, third trimester: Secondary | ICD-10-CM | POA: Diagnosis not present

## 2021-05-24 DIAGNOSIS — J45909 Unspecified asthma, uncomplicated: Secondary | ICD-10-CM | POA: Diagnosis not present

## 2021-05-24 DIAGNOSIS — Z8759 Personal history of other complications of pregnancy, childbirth and the puerperium: Secondary | ICD-10-CM | POA: Diagnosis not present

## 2021-05-24 DIAGNOSIS — F419 Anxiety disorder, unspecified: Secondary | ICD-10-CM | POA: Diagnosis not present

## 2021-05-24 DIAGNOSIS — O4703 False labor before 37 completed weeks of gestation, third trimester: Secondary | ICD-10-CM | POA: Diagnosis not present

## 2021-05-24 DIAGNOSIS — F418 Other specified anxiety disorders: Secondary | ICD-10-CM | POA: Diagnosis not present

## 2021-05-24 DIAGNOSIS — Z3A33 33 weeks gestation of pregnancy: Secondary | ICD-10-CM | POA: Diagnosis not present

## 2021-05-24 DIAGNOSIS — O471 False labor at or after 37 completed weeks of gestation: Secondary | ICD-10-CM | POA: Diagnosis not present

## 2021-05-24 DIAGNOSIS — O99513 Diseases of the respiratory system complicating pregnancy, third trimester: Secondary | ICD-10-CM | POA: Diagnosis not present

## 2021-05-24 DIAGNOSIS — O09213 Supervision of pregnancy with history of pre-term labor, third trimester: Secondary | ICD-10-CM | POA: Diagnosis not present

## 2021-05-24 DIAGNOSIS — O98513 Other viral diseases complicating pregnancy, third trimester: Secondary | ICD-10-CM | POA: Diagnosis not present

## 2021-05-24 DIAGNOSIS — F32A Depression, unspecified: Secondary | ICD-10-CM | POA: Diagnosis not present

## 2021-05-24 DIAGNOSIS — U071 COVID-19: Secondary | ICD-10-CM | POA: Diagnosis not present

## 2021-05-24 NOTE — Telephone Encounter (Signed)
The nurse navigator at [facility] maternity unit called with an update. This pt was transferred last night from [facility] in pre-term labor. Since admission, the pt's contractions have slowed and there have been no cervical changes. Pt will remain there to be monitored and discharged when appropriate, either after delivery or once stable.

## 2021-05-25 ENCOUNTER — Ambulatory Visit: Payer: Medicaid Other

## 2021-05-29 ENCOUNTER — Telehealth: Payer: Self-pay

## 2021-05-29 NOTE — Telephone Encounter (Signed)
Transition Care Management Unsuccessful Follow-up Telephone Call  Date of discharge and from where:  05/26/2021-[facility]   Attempts:  1st Attempt  Reason for unsuccessful TCM follow-up call:  Left voice message

## 2021-05-30 ENCOUNTER — Encounter: Payer: Self-pay | Admitting: Family Medicine

## 2021-05-30 ENCOUNTER — Ambulatory Visit (INDEPENDENT_AMBULATORY_CARE_PROVIDER_SITE_OTHER): Payer: Medicaid Other | Admitting: Obstetrics & Gynecology

## 2021-05-30 ENCOUNTER — Encounter: Payer: Self-pay | Admitting: Obstetrics & Gynecology

## 2021-05-30 ENCOUNTER — Other Ambulatory Visit: Payer: Self-pay

## 2021-05-30 ENCOUNTER — Ambulatory Visit (INDEPENDENT_AMBULATORY_CARE_PROVIDER_SITE_OTHER): Payer: Medicaid Other | Admitting: Family Medicine

## 2021-05-30 VITALS — BP 114/81 | HR 112 | Wt 156.0 lb

## 2021-05-30 DIAGNOSIS — Z23 Encounter for immunization: Secondary | ICD-10-CM

## 2021-05-30 DIAGNOSIS — R252 Cramp and spasm: Secondary | ICD-10-CM | POA: Diagnosis not present

## 2021-05-30 DIAGNOSIS — Z3A34 34 weeks gestation of pregnancy: Secondary | ICD-10-CM | POA: Diagnosis not present

## 2021-05-30 DIAGNOSIS — Z3480 Encounter for supervision of other normal pregnancy, unspecified trimester: Secondary | ICD-10-CM

## 2021-05-30 DIAGNOSIS — F418 Other specified anxiety disorders: Secondary | ICD-10-CM

## 2021-05-30 MED ORDER — CITALOPRAM HYDROBROMIDE 40 MG PO TABS: 40.0000 mg | ORAL_TABLET | Freq: Every day | ORAL | 4 refills | Status: DC

## 2021-05-30 NOTE — Telephone Encounter (Signed)
Transition Care Management Unsuccessful Follow-up Telephone Call  Date of discharge and from where:  05/26/2021-[facility]   Attempts:  2nd Attempt  Reason for unsuccessful TCM follow-up call:  Left voice message

## 2021-05-30 NOTE — Progress Notes (Signed)
HIGH-RISK PREGNANCY VISIT Patient name: Cheryl Frank MRN [id number]  Date of birth: 02-28-2001 Chief Complaint:   Routine Prenatal Visit (Cervical exam)  History of Present Illness:   Cheryl Frank is a 20 y.o. G10P1102 female at [redacted]w[redacted]d with an Estimated Date of Delivery: 07/07/21 being seen today for ongoing management of a high-risk pregnancy complicated by   -h/o PPROM- on prometrium.  -Preterm labor- outside facility-cervix:4/50/-3, given BMZ 7/19-7/20   Still noting some irregular ctx, but denies regular painful contractions -Dep/anxiety: on celexa 20mg  daily  Denies SI/HI.  Feels like this medication is no longer working as well as it used to   Contractions: Irritability. Vag. Bleeding: None.  Movement: Present. denies leaking of fluid.   Depression screen [facility] 2/9 01/04/2021 10/31/2020 07/13/2020 12/30/2017 11/20/2017  Decreased Interest 1 1 1  0 0  Down, Depressed, Hopeless 2 1 2  0 0  PHQ - 2 Score 3 2 3  0 0  Altered sleeping 1 0 1 - -  Tired, decreased energy 3 1 2  - -  Change in appetite 1 1 2  - -  Feeling bad or failure about yourself  0 1 1 - -  Trouble concentrating 1 1 3  - -  Moving slowly or fidgety/restless 1 1 1  - -  Suicidal thoughts 0 0 0 - -  PHQ-9 Score 10 7 13  - -  Difficult doing work/chores - Somewhat difficult - - -     Current Outpatient Medications  Medication Instructions   albuterol (VENTOLIN HFA) 108 (90 Base) MCG/ACT inhaler 2 puffs, Inhalation, Every 6 hours PRN   citalopram (CELEXA) 40 mg, Oral, Daily   fluticasone (FLOVENT DISKUS) 50 MCG/BLIST diskus inhaler 1 puff, Inhalation, 2 times daily   Prenat w/o A-FeCbGl-DSS-FA-DHA (CITRANATAL ASSURE) 35-1 & 300 MG tablet One tablet and one capsule daily   promethazine (PHENERGAN) 12.5-25 mg, Oral, Every 6 hours PRN     Review of Systems:   Pertinent items are noted in HPI Denies abnormal vaginal discharge w/ itching/odor/irritation, headaches, visual changes, shortness of breath, chest pain, abdominal  pain, severe nausea/vomiting, or problems with urination or bowel movements unless otherwise stated above. Pertinent History Reviewed:  Reviewed past medical,surgical, social, obstetrical and family history.  Reviewed problem list, medications and allergies. Physical Assessment:   Vitals:   05/30/21 1350  BP: 114/81  Pulse: (!) 112  Weight: 156 lb (70.8 kg)  Body mass index is 28.53 kg/m.           Physical Examination:   General appearance: alert, well appearing, and in no distress  Mental status: alert, oriented to person, place, and time  Skin: warm & dry   Extremities: Edema: None    Cardiovascular: normal heart rate noted  Respiratory: normal respiratory effort, no distress  Abdomen: gravid, soft, non-tender  Pelvic: Cervical exam performed  Dilation: 4 Effacement (%): 50 Station: -3  Fetal Status: Fetal Heart Rate (bpm): 140 Fundal Height: 33 cm Movement: Present    Fetal Surveillance Testing today: doppler   Chaperone:  declined     No results found for this or any previous visit (from the past 24 hour(s)).   Assessment & Plan:  High-risk pregnancy: at [redacted]w[redacted]d with an Estimated Date of Delivery: 07/07/21   1) Preterm labor -cervix stable at 4cm S/p BMZ course  2) Dep/Anxiety- increase celexa to 40mg  daily  Meds:  Meds ordered this encounter  Medications   citalopram (CELEXA) 40 MG tablet    Sig: Take 1 tablet (  40 mg total) by mouth daily.    Dispense:  90 tablet    Refill:  4    Labs/procedures today: Tdap  Treatment Plan:  plan for weekly visits due to preterm dilation  Reviewed: Preterm labor symptoms and general obstetric precautions including but not limited to vaginal bleeding, contractions, leaking of fluid and fetal movement were reviewed in detail with the patient.  All questions were answered. Pt has home bp cuff. Check bp weekly, let us know if >140/90.   Follow-up: Return in about 1 week (around 06/06/2021) for [facility] visit.   Future  Appointments  Date Time Provider Department Center  05/30/2021  3:35 PM Gwenlyn Fudge, FNP [facility] None  06/06/2021  1:30 PM Despina Hidden Amaryllis Dyke, MD [facility]    Orders Placed This Encounter  Procedures   Tdap vaccine greater than or equal to 7yo IM    Myna Hidalgo, DO Attending Obstetrician & Gynecologist, [facility] for [employer], [facility]

## 2021-05-30 NOTE — Progress Notes (Signed)
Virtual Visit via Telephone Note  I connected with Cheryl Frank on 05/30/21 at 3:29 PM by telephone and verified that I am speaking with the correct person using two identifiers. Cheryl Frank is currently located at home and nobody is currently with her during this visit. The provider, Loman Brooklyn, FNP is located in their office at time of visit.  I discussed the limitations, risks, security and privacy concerns of performing an evaluation and management service by telephone and the availability of in person appointments. I also discussed with the patient that there may be a patient responsible charge related to this service. The patient expressed understanding and agreed to proceed.  Subjective: PCP: Loman Brooklyn, FNP  Chief Complaint  Patient presents with   Medication Refill   Leg cramps   Anxiety/depression: Patient is requesting a refill of her Celexa 40 mg once daily.  Patient states her legs continue to have really bad cramps.  She is pregnant.  She was seen at [facility] on 05/23/2021 at which time she was prescribed Flexeril 5 mg 3 times daily as needed.  She states it is not helping.   ROS: Per HPI  Current Outpatient Medications:    albuterol (VENTOLIN HFA) 108 (90 Base) MCG/ACT inhaler, Inhale 2 puffs into the lungs every 6 (six) hours as needed., Disp: 18 g, Rfl: 2   citalopram (CELEXA) 40 MG tablet, Take 1 tablet (40 mg total) by mouth daily., Disp: 90 tablet, Rfl: 4   fluticasone (FLOVENT DISKUS) 50 MCG/BLIST diskus inhaler, Inhale 1 puff into the lungs 2 (two) times daily., Disp: 1 each, Rfl: 12   Prenat w/o A-FeCbGl-DSS-FA-DHA (CITRANATAL ASSURE) 35-1 & 300 MG tablet, One tablet and one capsule daily, Disp: 90 each, Rfl: 3   promethazine (PHENERGAN) 25 MG tablet, Take 0.5-1 tablets (12.5-25 mg total) by mouth every 6 (six) hours as needed for nausea or vomiting. (Patient not taking: Reported on 05/30/2021), Disp: 30 tablet, Rfl: 0  Allergies  Allergen  Reactions   Penicillins Anaphylaxis    Throat swells   Past Medical History:  Diagnosis Date   Anxiety    Depression    GERD (gastroesophageal reflux disease)    Gonorrhea 01/2021    Observations/Objective: A&O  No respiratory distress or wheezing audible over the phone Mood, judgement, and thought processes all WNL   Assessment and Plan: 1. Depression with anxiety Discussed that this is managed by her OB and that a refill was sent to the pharmacy by them today.  2. Leg cramping Encouraged patient to send her OB a MyChart message and let them know what is going on as they would be most appropriate to treat since she is pregnant.  I did order some lab work to make sure there is not anything else going on. - CMP14+EGFR; Future - TSH; Future - Magnesium; Future    Follow Up Instructions:  I discussed the assessment and treatment plan with the patient. The patient was provided an opportunity to ask questions and all were answered. The patient agreed with the plan and demonstrated an understanding of the instructions.   The patient was advised to call back or seek an in-person evaluation if the symptoms worsen or if the condition fails to improve as anticipated.  The above assessment and management plan was discussed with the patient. The patient verbalized understanding of and has agreed to the management plan. Patient is aware to call the clinic if symptoms persist or worsen. Patient is aware  when to return to the clinic for a follow-up visit. Patient educated on when it is appropriate to go to the emergency department.   Time call ended: 3:40 PM  I provided 11 minutes of non-face-to-face time during this encounter.  Hendricks Limes, MSN, APRN, FNP-C [facility] 05/30/21

## 2021-05-31 NOTE — Telephone Encounter (Signed)
Transition Care Management Unsuccessful Follow-up Telephone Call  Date of discharge and from where:  05/26/2021-[facility]   Attempts:  3rd Attempt  Reason for unsuccessful TCM follow-up call:  Unable to reach patient

## 2021-06-01 ENCOUNTER — Ambulatory Visit: Payer: Medicaid Other

## 2021-06-05 DIAGNOSIS — Z419 Encounter for procedure for purposes other than remedying health state, unspecified: Secondary | ICD-10-CM | POA: Diagnosis not present

## 2021-06-06 ENCOUNTER — Ambulatory Visit (INDEPENDENT_AMBULATORY_CARE_PROVIDER_SITE_OTHER): Payer: Medicaid Other | Admitting: Obstetrics & Gynecology

## 2021-06-06 ENCOUNTER — Other Ambulatory Visit: Payer: Self-pay

## 2021-06-06 ENCOUNTER — Encounter: Payer: Self-pay | Admitting: Obstetrics & Gynecology

## 2021-06-06 VITALS — BP 105/70 | HR 110 | Wt 158.5 lb

## 2021-06-06 DIAGNOSIS — O0993 Supervision of high risk pregnancy, unspecified, third trimester: Secondary | ICD-10-CM

## 2021-06-06 DIAGNOSIS — Z3A35 35 weeks gestation of pregnancy: Secondary | ICD-10-CM | POA: Diagnosis not present

## 2021-06-06 LAB — POCT URINALYSIS DIPSTICK OB
Blood, UA: NEGATIVE
Glucose, UA: NEGATIVE
Ketones, UA: NEGATIVE
Nitrite, UA: NEGATIVE
POC,PROTEIN,UA: NEGATIVE

## 2021-06-06 NOTE — Progress Notes (Signed)
LOW-RISK PREGNANCY VISIT Patient name: Cheryl Frank MRN [id number]  Date of birth: 03/12/2001 Chief Complaint:   High Risk Gestation (+ cramps; losing mucus plug)  History of Present Illness:   Cheryl Frank is a 20 y.o. G5P1102 female at [redacted]w[redacted]d with an Estimated Date of Delivery: 07/07/21 being seen today for ongoing management of a low-risk pregnancy.  Depression screen [facility] 2/9 01/04/2021 10/31/2020 07/13/2020 12/30/2017 11/20/2017  Decreased Interest 1 1 1  0 0  Down, Depressed, Hopeless 2 1 2  0 0  PHQ - 2 Score 3 2 3  0 0  Altered sleeping 1 0 1 - -  Tired, decreased energy 3 1 2  - -  Change in appetite 1 1 2  - -  Feeling bad or failure about yourself  0 1 1 - -  Trouble concentrating 1 1 3  - -  Moving slowly or fidgety/restless 1 1 1  - -  Suicidal thoughts 0 0 0 - -  PHQ-9 Score 10 7 13  - -  Difficult doing work/chores - Somewhat difficult - - -    Today she reports  pelvic pressure . Contractions: Irregular. Vag. Bleeding: None.  Movement: Present. denies leaking of fluid. Review of Systems:   Pertinent items are noted in HPI Denies abnormal vaginal discharge w/ itching/odor/irritation, headaches, visual changes, shortness of breath, chest pain, abdominal pain, severe nausea/vomiting, or problems with urination or bowel movements unless otherwise stated above. Pertinent History Reviewed:  Reviewed past medical,surgical, social, obstetrical and family history.  Reviewed problem list, medications and allergies. Physical Assessment:   Vitals:   06/06/21 1343  BP: 105/70  Pulse: (!) 110  Weight: 158 lb 8 oz (71.9 kg)  Body mass index is 28.99 kg/m.        Physical Examination:   General appearance: Well appearing, and in no distress  Mental status: Alert, oriented to person, place, and time  Skin: Warm & dry  Cardiovascular: Normal heart rate noted  Respiratory: Normal respiratory effort, no distress  Abdomen: Soft, gravid, nontender  Pelvic: Cervical exam performed   Dilation: 4 Effacement (%): 50 Station: -2  Extremities: Edema: Trace  Fetal Status: Fetal Heart Rate (bpm): 150 Fundal Height: 36 cm Movement: Present Presentation: Vertex  Chaperone:    Results for orders placed or performed in visit on 06/06/21 (from the past 24 hour(s))  POC Urinalysis Dipstick OB   Collection Time: 06/06/21  1:46 PM  Result Value Ref Range   Color, UA     Clarity, UA     Glucose, UA Negative Negative   Bilirubin, UA     Ketones, UA neg    Spec Grav, UA     Blood, UA neg    pH, UA     POC,PROTEIN,UA Negative Negative, Trace, Small (1+), Moderate (2+), Large (3+), 4+   Urobilinogen, UA     Nitrite, UA neg    Leukocytes, UA Moderate (2+) (A) Negative   Appearance     Odor      Assessment & Plan:  1) Low-risk pregnancy at [redacted]w[redacted]d with an Estimated Date of Delivery: 07/07/21   2) advanced cervical change, stable   Meds: No orders of the defined types were placed in this encounter.  Labs/procedures today:   Plan:  Continue routine obstetrical care  Next visit: prefers in person    Reviewed: Preterm labor symptoms and general obstetric precautions including but not limited to vaginal bleeding, contractions, leaking of fluid and fetal movement were reviewed in detail  with the patient.  All questions were answered. Has home bp cuff. Rx faxed to . Check bp weekly, let us know if >140/90.   Follow-up: Return in about 10 days (around 06/16/2021) for LROB.  Orders Placed This Encounter  Procedures   POC Urinalysis Dipstick OB    Lazaro Arms, MD 06/06/2021 2:07 PM

## 2021-06-07 DIAGNOSIS — Z0379 Encounter for other suspected maternal and fetal conditions ruled out: Secondary | ICD-10-CM | POA: Diagnosis not present

## 2021-06-07 DIAGNOSIS — Z3A00 Weeks of gestation of pregnancy not specified: Secondary | ICD-10-CM | POA: Diagnosis not present

## 2021-06-08 ENCOUNTER — Ambulatory Visit: Payer: Medicaid Other

## 2021-06-08 DIAGNOSIS — Z0379 Encounter for other suspected maternal and fetal conditions ruled out: Secondary | ICD-10-CM | POA: Diagnosis not present

## 2021-06-08 DIAGNOSIS — Z3A00 Weeks of gestation of pregnancy not specified: Secondary | ICD-10-CM | POA: Diagnosis not present

## 2021-06-11 ENCOUNTER — Other Ambulatory Visit: Payer: Self-pay | Admitting: Student

## 2021-06-11 ENCOUNTER — Encounter (HOSPITAL_COMMUNITY): Payer: Self-pay | Admitting: Anesthesiology

## 2021-06-11 ENCOUNTER — Other Ambulatory Visit: Payer: Self-pay

## 2021-06-11 ENCOUNTER — Encounter (HOSPITAL_COMMUNITY): Payer: Self-pay | Admitting: Obstetrics & Gynecology

## 2021-06-11 ENCOUNTER — Encounter (HOSPITAL_COMMUNITY)
Admission: AD | Disposition: A | Discharge status: Home / Self Care | Payer: Self-pay | Source: Home / Self Care | Attending: Obstetrics & Gynecology

## 2021-06-11 ENCOUNTER — Inpatient Hospital Stay (HOSPITAL_COMMUNITY)
Admission: AD | Admit: 2021-06-11 | Discharge: 2021-06-13 | DRG: 776 | Disposition: A | Discharge status: Home / Self Care | Payer: Medicaid Other | Attending: Obstetrics & Gynecology | Admitting: Obstetrics & Gynecology

## 2021-06-11 DIAGNOSIS — I10 Essential (primary) hypertension: Secondary | ICD-10-CM | POA: Diagnosis not present

## 2021-06-11 DIAGNOSIS — Z743 Need for continuous supervision: Secondary | ICD-10-CM | POA: Diagnosis not present

## 2021-06-11 DIAGNOSIS — Z3A36 36 weeks gestation of pregnancy: Secondary | ICD-10-CM | POA: Diagnosis not present

## 2021-06-11 DIAGNOSIS — O99345 Other mental disorders complicating the puerperium: Secondary | ICD-10-CM | POA: Diagnosis present

## 2021-06-11 DIAGNOSIS — R52 Pain, unspecified: Secondary | ICD-10-CM | POA: Diagnosis not present

## 2021-06-11 DIAGNOSIS — O2690 Pregnancy related conditions, unspecified, unspecified trimester: Secondary | ICD-10-CM | POA: Diagnosis not present

## 2021-06-11 DIAGNOSIS — Z87891 Personal history of nicotine dependence: Secondary | ICD-10-CM | POA: Diagnosis not present

## 2021-06-11 DIAGNOSIS — R0689 Other abnormalities of breathing: Secondary | ICD-10-CM | POA: Diagnosis not present

## 2021-06-11 DIAGNOSIS — F419 Anxiety disorder, unspecified: Secondary | ICD-10-CM | POA: Diagnosis present

## 2021-06-11 DIAGNOSIS — O411230 Chorioamnionitis, third trimester, not applicable or unspecified: Secondary | ICD-10-CM | POA: Diagnosis not present

## 2021-06-11 DIAGNOSIS — F32A Depression, unspecified: Secondary | ICD-10-CM | POA: Diagnosis not present

## 2021-06-11 DIAGNOSIS — R000 Tachycardia, unspecified: Secondary | ICD-10-CM | POA: Diagnosis not present

## 2021-06-11 DIAGNOSIS — O479 False labor, unspecified: Secondary | ICD-10-CM | POA: Diagnosis not present

## 2021-06-11 LAB — CBC
HCT: 33.2 % — ABNORMAL LOW (ref 36.0–46.0)
Hemoglobin: 10.6 g/dL — ABNORMAL LOW (ref 12.0–15.0)
MCH: 26.1 pg (ref 26.0–34.0)
MCHC: 31.9 g/dL (ref 30.0–36.0)
MCV: 81.8 fL (ref 80.0–100.0)
Platelets: 188 10*3/uL (ref 150–400)
RBC: 4.06 MIL/uL (ref 3.87–5.11)
RDW: 13.2 % (ref 11.5–15.5)
WBC: 17.1 10*3/uL — ABNORMAL HIGH (ref 4.0–10.5)
nRBC: 0 % (ref 0.0–0.2)

## 2021-06-11 LAB — RAPID URINE DRUG SCREEN, HOSP PERFORMED
Amphetamines: NOT DETECTED
Barbiturates: NOT DETECTED
Benzodiazepines: NOT DETECTED
Cocaine: NOT DETECTED
Opiates: NOT DETECTED
Tetrahydrocannabinol: NOT DETECTED

## 2021-06-11 LAB — RPR: RPR Ser Ql: NONREACTIVE

## 2021-06-11 SURGERY — LIGATION, FALLOPIAN TUBE, POSTPARTUM
Anesthesia: Spinal

## 2021-06-11 MED ORDER — BENZOCAINE-MENTHOL 20-0.5 % EX AERO
1.0000 | INHALATION_SPRAY | CUTANEOUS | Status: DC | PRN
Start: 2021-06-11 — End: 2021-06-13

## 2021-06-11 MED ORDER — DIBUCAINE (PERIANAL) 1 % EX OINT
1.0000 | TOPICAL_OINTMENT | CUTANEOUS | Status: DC | PRN
Start: 2021-06-11 — End: 2021-06-13

## 2021-06-11 MED ORDER — COCONUT OIL OIL: 1.0000 "application " | TOPICAL_OIL | Status: DC | PRN

## 2021-06-11 MED ORDER — LACTATED RINGERS IV SOLN: INTRAVENOUS | Status: DC

## 2021-06-11 MED ORDER — TETANUS-DIPHTH-ACELL PERTUSSIS 5-2.5-18.5 LF-MCG/0.5 IM SUSY: 0.5000 mL | PREFILLED_SYRINGE | Freq: Once | INTRAMUSCULAR | Status: DC

## 2021-06-11 MED ORDER — IBUPROFEN 600 MG PO TABS
600.0000 mg | ORAL_TABLET | Freq: Four times a day (QID) | ORAL | Status: DC
  Administered 2021-06-11 – 2021-06-13 (×9): 600 mg via ORAL
  Filled 2021-06-11 (×9): qty 1

## 2021-06-11 MED ORDER — ONDANSETRON HCL 4 MG PO TABS: 4.0000 mg | ORAL_TABLET | ORAL | Status: DC | PRN

## 2021-06-11 MED ORDER — OXYCODONE HCL 5 MG PO TABS
5.0000 mg | ORAL_TABLET | Freq: Once | ORAL | Status: AC
  Administered 2021-06-11: 5 mg via ORAL
  Filled 2021-06-11: qty 1

## 2021-06-11 MED ORDER — ACETAMINOPHEN 325 MG PO TABS
650.0000 mg | ORAL_TABLET | ORAL | Status: DC | PRN
  Administered 2021-06-11 – 2021-06-13 (×7): 650 mg via ORAL
  Filled 2021-06-11 (×7): qty 2

## 2021-06-11 MED ORDER — ZOLPIDEM TARTRATE 5 MG PO TABS: 5.0000 mg | ORAL_TABLET | Freq: Every evening | ORAL | Status: DC | PRN

## 2021-06-11 MED ORDER — WITCH HAZEL-GLYCERIN EX PADS: 1.0000 "application " | MEDICATED_PAD | CUTANEOUS | Status: DC | PRN

## 2021-06-11 MED ORDER — DIPHENHYDRAMINE HCL 25 MG PO CAPS: 25.0000 mg | ORAL_CAPSULE | Freq: Four times a day (QID) | ORAL | Status: DC | PRN

## 2021-06-11 MED ORDER — BISACODYL 5 MG PO TBEC
5.0000 mg | DELAYED_RELEASE_TABLET | Freq: Every day | ORAL | Status: DC | PRN
  Administered 2021-06-11: 5 mg via ORAL
  Filled 2021-06-11 (×2): qty 1

## 2021-06-11 MED ORDER — SENNOSIDES-DOCUSATE SODIUM 8.6-50 MG PO TABS
2.0000 | ORAL_TABLET | Freq: Every day | ORAL | Status: DC
Start: 2021-06-12 — End: 2021-06-13
  Administered 2021-06-12: 2 via ORAL
  Filled 2021-06-11: qty 2

## 2021-06-11 MED ORDER — POLYETHYLENE GLYCOL 3350 17 G PO PACK
17.0000 g | PACK | Freq: Two times a day (BID) | ORAL | Status: DC
  Administered 2021-06-11 – 2021-06-12 (×3): 17 g via ORAL
  Filled 2021-06-11 (×4): qty 1

## 2021-06-11 MED ORDER — SIMETHICONE 80 MG PO CHEW
80.0000 mg | CHEWABLE_TABLET | ORAL | Status: DC | PRN
  Filled 2021-06-11: qty 1

## 2021-06-11 MED ORDER — PRENATAL MULTIVITAMIN CH
1.0000 | ORAL_TABLET | Freq: Every day | ORAL | Status: DC
Start: 2021-06-11 — End: 2021-06-13
  Administered 2021-06-12: 1 via ORAL
  Filled 2021-06-11: qty 1

## 2021-06-11 MED ORDER — OXYTOCIN 10 UNIT/ML IJ SOLN
INTRAMUSCULAR | Status: AC
  Administered 2021-06-11: 10 [IU]
  Filled 2021-06-11: qty 1

## 2021-06-11 MED ORDER — OXYTOCIN-SODIUM CHLORIDE 30-0.9 UT/500ML-% IV SOLN
INTRAVENOUS | Status: AC
  Administered 2021-06-11: 41.7 mL
  Filled 2021-06-11: qty 500

## 2021-06-11 MED ORDER — ONDANSETRON HCL 4 MG/2ML IJ SOLN: 4.0000 mg | INTRAMUSCULAR | Status: DC | PRN

## 2021-06-11 MED ORDER — OXYCODONE HCL 5 MG PO TABS
5.0000 mg | ORAL_TABLET | Freq: Four times a day (QID) | ORAL | Status: DC | PRN
Start: 2021-06-11 — End: 2021-06-12
  Administered 2021-06-11 – 2021-06-12 (×2): 5 mg via ORAL
  Filled 2021-06-11 (×2): qty 1

## 2021-06-11 NOTE — Discharge Summary (Signed)
Postpartum Discharge Summary  Date of Service updated 06/13/21     Patient Name: Cheryl Frank DOB: December 23, 2000 MRN: [id number]  Date of admission: 06/11/2021 Delivery date:06/11/2021  Delivering provider: Gavin Pound  Date of discharge: 06/13/2021  Admitting diagnosis: Indication for care in labor and delivery, delivered [O75.9] Intrauterine pregnancy: [redacted]w[redacted]d    Secondary diagnosis:  Active Problems:   Indication for care in labor and delivery, delivered  Additional problems: NA    Discharge diagnosis: Preterm Pregnancy Delivered                                              Post partum procedures:postpartum tubal ligation Augmentation: N/A Complications: None  Hospital course: Onset of Labor With Vaginal Delivery      20y.o. yo [phone] 35w2das admitted after delivery of infant in ambulance on 06/11/2021. Patient states contractions started ~ 30 minutes prior to calling EMS. She was 4cm and had received BMZ July 19th and 20th.  Membrane Rupture Time/Date: 12:20 AM ,06/11/2021   Delivery Method:Vaginal, Spontaneous  Episiotomy:   Lacerations:  Periurethral  Patient had an uncomplicated postpartum course.  She is ambulating, tolerating a regular diet, passing flatus, and urinating well. Patient is discharged home in stable condition on 06/13/21.  Newborn Data: Birth date:06/11/2021  Birth time:12:28 AM  Gender:Female  Living status:Living  Apgars:6 ,8  [phone]   Magnesium Sulfate received: No BMZ received: No Rhophylac:No MMR:N/A T-DaP: NA Flu: N/A Transfusion:No  Physical exam  Vitals:   06/12/21 1121 06/12/21 1625 06/12/21 1958 06/13/21 0303  BP: (!) 97/53 104/64 106/60 101/61  Pulse: 83 94 97 97  Resp: _0 Temp: 98.6 F (37 C) 97.9 F (36.6 C) 98.9 F (37.2 C) 97.8 F (36.6 C)  TempSrc: Oral Axillary Axillary Oral  SpO2: 100% 100%  100%  Weight:      Height:       General: alert Lochia: appropriate Uterine Fundus: firm Incision: Healing  well with no significant drainage DVT Evaluation: No evidence of DVT seen on physical exam. Labs: Lab Results  Component Value Date   WBC 17.1 (H) 06/11/2021   HGB 10.6 (L) 06/11/2021   HCT 33.2 (L) 06/11/2021   MCV 81.8 06/11/2021   PLT 188 06/11/2021   CMP Latest Ref Rng & Units 08/07/2015  Glucose 65 - 99 mg/dL 90  BUN 6 - 20 mg/dL 8  Creatinine 0.50 - 1.00 mg/dL 0.84  Sodium 135 - 145 mmol/L 140  Potassium 3.5 - 5.1 mmol/L 3.9  Chloride 101 - 111 mmol/L 107  CO2 22 - 32 mmol/L 27  Calcium 8.9 - 10.3 mg/dL 8.8(L)   EdFlavia Frank: Edinburgh Postnatal Depression Scale Screening Tool 06/12/2021  I have been able to laugh and see the funny side of things. 0  I have looked forward with enjoyment to things. 0  I have blamed myself unnecessarily when things went wrong. 3  I have been anxious or worried for no good reason. 3  I have felt scared or panicky for no good reason. 2  Things have been getting on top of me. 2  I have been so unhappy that I have had difficulty sleeping. 0  I have felt sad or miserable. 2  I have been so unhappy that I have been crying. 0  The thought of harming  myself has occurred to me. 0  Edinburgh Postnatal Depression Scale Total 12     After visit meds:  Allergies as of 06/13/2021       Reactions   Penicillins Anaphylaxis   Throat swells        Medication List     STOP taking these medications    promethazine 25 MG tablet Commonly known as: PHENERGAN       TAKE these medications    albuterol 108 (90 Base) MCG/ACT inhaler Commonly known as: VENTOLIN HFA Inhale 2 puffs into the lungs every 6 (six) hours as needed.   citalopram 40 MG tablet Commonly known as: CeleXA Take 1 tablet (40 mg total) by mouth daily.   CitraNatal Assure 35-1 & 300 MG tablet One tablet and one capsule daily   Flovent Diskus 50 MCG/BLIST diskus inhaler Generic drug: fluticasone Inhale 1 puff into the lungs 2 (two) times daily.   ibuprofen 600 MG  tablet Commonly known as: ADVIL Take 1 tablet (600 mg total) by mouth every 6 (six) hours.         Discharge home in stable condition Infant Feeding: Bottle Infant Disposition:home with mother Discharge instruction: per After Visit Summary and Postpartum booklet. Activity: Advance as tolerated. Pelvic rest for 6 weeks.  Diet: routine diet Future Appointments: Future Appointments  Date Time Provider [place]  06/16/2021 10:50 AM Patriciaann Clan, DO [facility]  07/18/2021 11:30 AM Roma Schanz, CNM [facility]   Follow up Visit:  Follow-up Information     [facility]. Schedule an appointment as soon as possible for a visit in 2 week(s).   Specialty: Obstetrics and Gynecology Contact information: [address] [place] [phone]               Sent to D. Norma Fredrickson 06/11/2021   Please schedule this patient for a In person postpartum visit in 4 weeks with the following provider: Any provider. Additional Postpartum F/U:Postpartum Depression checkup  High risk pregnancy complicated by:  H/O PTD, PTL, Anxiety/Depression Delivery mode:  Vaginal, Spontaneous  Anticipated Birth Control:  Interval BTL   06/13/2021 Cheryl Milroy, MD

## 2021-06-11 NOTE — Progress Notes (Signed)
After receiving word that her BTL would be pushed to 1600, pt decided to decline BTL at this time and prefers to have the procedure at a later date. OR and MD aware.

## 2021-06-11 NOTE — Progress Notes (Signed)
OB Note I met with the patient and d/w her re: BTL including permanency, no affect on periods, regret, standard surgical risks. Pt desires to proceed. BTL papers up to date.   Durene Romans MD Attending Center for [employer] (Faculty Practice) 06/11/2021 Time: [phone]

## 2021-06-11 NOTE — Progress Notes (Signed)
RN found pt w/ sandwich at bedside after education about NPO status. Pt states she was starving and couldn't wait but agrees to stay NPO now. Provider notified.

## 2021-06-11 NOTE — H&P (Addendum)
Cheryl Frank is a 20 y.o. [id number] presenting for admission s/t SVD while en route to the hospital.  Patient receives care at [facility] and was supervised for a high-risk pregnancy. Pregnancy and medical history significant for problems as listed below. She is GBS unknown.  She is requests BTL for PP birth control method.    -Uncomplicated Term SVD  -h/o PPROM/PTL- Delivery at 33wks on front steps of home. Makena Injections d/c at 21 wks d/t c/o side effects. Prometrium started -Preterm labor- outside facility-cervix:4/50/-3, given BMZ 7/19-7/20            -Dep/anxiety: on celexa 40mg  daily -H/O GC with Negative TOC       OB History     Gravida  3   Para  2   Term  1   Preterm  1   AB      Living  2      SAB      IAB      Ectopic      Multiple      Live Births  2          Past Medical History:  Diagnosis Date   Anxiety    Depression    GERD (gastroesophageal reflux disease)    Gonorrhea 01/2021   Past Surgical History:  Procedure Laterality Date   APPENDECTOMY     Family History: family history is not on file. Social History:  reports that she quit smoking about 3 years ago. Her smoking use included cigarettes. She has never used smokeless tobacco. She reports that she does not drink alcohol and does not use drugs.     Maternal Diabetes: No Genetic Screening: Normal Maternal Ultrasounds/Referrals: Normal Fetal Ultrasounds or other Referrals:  None Maternal Substance Abuse:  No Significant Maternal Medications:  Meds include: Other:  Celexa Significant Maternal Lab Results:  Other: GBS Unknown Other Comments:  None  Review of Systems  Constitutional:  Negative for chills and fever.  Respiratory:  Negative for cough and shortness of breath.   Gastrointestinal:  Positive for abdominal pain (Cramping). Negative for nausea and vomiting.  Genitourinary:  Negative for difficulty urinating and dysuria.  Neurological:  Negative for dizziness, light-headedness  and headaches.  Maternal Medical History:  Reason for admission: Nausea.     Last menstrual period 06/22/2020, not currently breastfeeding. Maternal Exam:  Abdomen: Patient reports no abdominal tenderness.  Physical Exam Vitals reviewed.  Constitutional:      Appearance: Normal appearance.  HENT:     Head: Normocephalic and atraumatic.  Eyes:     Conjunctiva/sclera: Conjunctivae normal.  Cardiovascular:     Rate and Rhythm: Normal rate.  Abdominal:     General: Bowel sounds are normal.     Tenderness: There is no abdominal tenderness.  Genitourinary:    Comments: Periclitoral Laceration-Hemostatic Right Labial-Hemostatic Musculoskeletal:        General: Normal range of motion.     Cervical back: Normal range of motion.  Skin:    General: Skin is warm and dry.  Neurological:     Mental Status: She is alert and oriented to person, place, and time.  Psychiatric:        Mood and Affect: Mood normal.        Behavior: Behavior normal.        Thought Content: Thought content normal.    Prenatal labs: ABO, Rh: A/Positive/-- (03/02 1227) Antibody: Negative (03/02 1227) Rubella: 1.48 (03/02 1227) RPR: Non Reactive (03/02 1227)  HBsAg:  Negative (03/02 1227)  HIV: Non Reactive (03/02 1227)  GBS:   Unknown  Assessment/Plan: 20 year old SVD of 36.2 wk Female H/O Depression/Anxiety Desires BTL  Admit to [employer]  Routine LD and PP Orders per Protocol In room to complete assessment. See delivery note for further information. Dr. Macon Large notified of patient desire for BTL. Patient to be NPO after 0330 for BTL scheduled at 1130 Patient to [facility] for PP course d/t infant to NICU.  Cherre Robins, CNM 06/11/2021, 1:18 AM   Attestation of Attending Supervision of Advanced Practice Provider (PA/CNM/NP): Evaluation and management procedures were performed by the Advanced Practice Provider under my supervision and collaboration.  I have reviewed the Advanced Practice  Provider's note and chart, and I agree with the management and plan. I have also made any necessary editorial changes.   Jaynie Collins, MD, FACOG Attending Obstetrician & Gynecologist, [facility] for [employer], [facility]

## 2021-06-12 ENCOUNTER — Encounter (HOSPITAL_COMMUNITY): Payer: Self-pay | Admitting: Student

## 2021-06-12 DIAGNOSIS — Z3A36 36 weeks gestation of pregnancy: Secondary | ICD-10-CM | POA: Diagnosis not present

## 2021-06-12 LAB — TYPE AND SCREEN
ABO/RH(D): A POS
Antibody Screen: NEGATIVE

## 2021-06-12 NOTE — Plan of Care (Signed)
  Problem: Clinical Measurements: Goal: Ability to maintain clinical measurements within normal limits will improve Outcome: Progressing Goal: Will remain free from infection Outcome: Progressing Goal: Cardiovascular complication will be avoided Outcome: Progressing   Problem: Activity: Goal: Risk for activity intolerance will decrease Outcome: Progressing Note: RN encouraged frequent mobility and walking. Patient walked 1 time during a 12 hour shift. Complains of weakness and pain.   Problem: Nutrition: Goal: Adequate nutrition will be maintained Outcome: Progressing   Problem: Coping: Goal: Level of anxiety will decrease Outcome: Progressing Note: Pt displays anxiety related to living situation. RN provided support    Problem: Elimination: Goal: Will not experience complications related to bowel motility Outcome: Progressing Goal: Will not experience complications related to urinary retention Outcome: Progressing   Problem: Pain Managment: Goal: General experience of comfort will improve Outcome: Progressing Note: Patient experiences discomfort related to childbirth   Problem: Safety: Goal: Ability to remain free from injury will improve Outcome: Progressing   Problem: Skin Integrity: Goal: Risk for impaired skin integrity will decrease Outcome: Progressing   Problem: Education: Goal: Knowledge of condition will improve Outcome: Progressing Goal: Individualized Educational Video(s) Outcome: Progressing Goal: Individualized Newborn Educational Video(s) Outcome: Progressing   Problem: Activity: Goal: Will verbalize the importance of balancing activity with adequate rest periods Outcome: Progressing Goal: Ability to tolerate increased activity will improve Outcome: Progressing   Problem: Coping: Goal: Ability to identify and utilize available resources and services will improve Outcome: Progressing   Problem: Life Cycle: Goal: Chance of risk for  complications during the postpartum period will decrease Outcome: Progressing   Problem: Role Relationship: Goal: Ability to demonstrate positive interaction with newborn will improve Outcome: Progressing   Problem: Skin Integrity: Goal: Demonstration of wound healing without infection will improve Outcome: Progressing

## 2021-06-12 NOTE — Progress Notes (Signed)
Daily Post Partum Note  06/12/2021 Cheryl Frank is a 20 y.o. O6Z1245 PPD#1 s/p  SVD at 36wks via EMS  Pregnancy c/b PTL 24hr/overnight events:  Patient didn't want to wait for BTL yesterday  Subjective:  Meeting all PP goals   Objective:    Current Vital Signs 24h Vital Sign Ranges  T 97.8 F (36.6 C) Temp  Avg: 98 F (36.7 C)  Min: 97.8 F (36.6 C)  Max: 98.2 F (36.8 C)  BP (!) 101/57  BP  Min: 96/57  Max: 114/68  HR 63 Pulse  Avg: 84.5  Min: 63  Max: 99  RR 18 Resp  Avg: 17.3  Min: 16  Max: 18  SaO2 99 % Room Air SpO2  Avg: 99.8 %  Min: 99 %  Max: 100 %       24 Hour I/O Current Shift I/O  Time Ins Outs 08/07 0701 - 08/08 0700 In: 64.8 [I.V.:64.8] Out: -  No intake/output data recorded.    General: NAD Abdomen: soft, nttp. Firm fundus below the umbilicus. Perineum: deferred Skin:  Warm and dry.  Respiratory: Normal respiratory effort Extremities: no c/c/e  Medications Current Facility-Administered Medications  Medication Dose Route Frequency Provider Last Rate Last Admin   acetaminophen (TYLENOL) tablet 650 mg  650 mg Oral Q4H PRN Marylene Land, CNM   650 mg at 06/12/21 0456   benzocaine-Menthol (DERMOPLAST) 20-0.5 % topical spray 1 application  1 application Topical PRN Marylene Land, CNM       bisacodyl (DULCOLAX) EC tablet 5 mg  5 mg Oral Daily PRN [employer] Bing, MD   5 mg at 06/11/21 2229   coconut oil  1 application Topical PRN Marylene Land, CNM       witch hazel-glycerin (TUCKS) pad 1 application  1 application Topical PRN Marylene Land, CNM       And   dibucaine (NUPERCAINAL) 1 % rectal ointment 1 application  1 application Rectal PRN Marylene Land, CNM       diphenhydrAMINE (BENADRYL) capsule 25 mg  25 mg Oral Q6H PRN Marylene Land, CNM       ibuprofen (ADVIL) tablet 600 mg  600 mg Oral Q6H Marylene Land, CNM   600 mg at 06/12/21 0215   ondansetron (ZOFRAN)  tablet 4 mg  4 mg Oral Q4H PRN Marylene Land, CNM       Or   ondansetron [facility] injection 4 mg  4 mg Intravenous Q4H PRN Marylene Land, CNM       oxyCODONE (Oxy IR/ROXICODONE) immediate release tablet 5 mg  5 mg Oral Q6H PRN [place] Bing, MD   5 mg at 06/11/21 2327   polyethylene glycol (MIRALAX / GLYCOLAX) packet 17 g  17 g Oral BID [place] Bing, MD   17 g at 06/11/21 1614   prenatal multivitamin tablet 1 tablet  1 tablet Oral Q1200 Marylene Land, CNM       senna-docusate (Senokot-S) tablet 2 tablet  2 tablet Oral Daily Marylene Land, CNM       simethicone [facility] chewable tablet 80 mg  80 mg Oral PRN Marylene Land, CNM       Tdap (BOOSTRIX) injection 0.5 mL  0.5 mL Intramuscular Once [place], Charlesetta Garibaldi, CNM       zolpidem (AMBIEN) tablet 5 mg  5 mg Oral QHS PRN Marylene Land, CNM        Labs:  Recent [employer]  06/11/21 0206  WBC 17.1*  HGB 10.6*  HCT 33.2*  PLT 188   No results for input(s): NA, K, CL, CO2, BUN, CREATININE, LABGLOM, GLUCOSE, CALCIUM in the last 168 hours.  Assessment & Plan:  Pt doing well *Postpartum/postop: A POS. Desires interval BTL. SW consulted *Dispo: d/w pt that okay for d/c today if she desires.   Cornelia Copa MD Attending Center for [facility])

## 2021-06-12 NOTE — Progress Notes (Signed)
CSW attempted to meet with MOB to complete psychosocial assessment, MOB was laying down and requested that CSW come back at a later time. CSW agreed to meet with MOB tomorrow. MOB shared that she will need housing resources, CSW agreed to provide.    Layney Gillson, LCSW Clinical Social Worker [facility] Cell#: [phone] 

## 2021-06-12 NOTE — Progress Notes (Signed)
Patient complains of episodic weakness and exhaustion. MD made aware. Vital signs WNL. Patient displayed no difficulty standing or walking.

## 2021-06-13 DIAGNOSIS — Z3A36 36 weeks gestation of pregnancy: Secondary | ICD-10-CM | POA: Diagnosis not present

## 2021-06-13 LAB — SURGICAL PATHOLOGY

## 2021-06-13 MED ORDER — IBUPROFEN 600 MG PO TABS
600.0000 mg | ORAL_TABLET | Freq: Four times a day (QID) | ORAL | 1 refills | Status: DC
Start: 2021-06-13 — End: 2024-06-04

## 2021-06-13 NOTE — Progress Notes (Signed)
Discharge instructions completed, all questions answered, pt verbalized understanding. Pt is alert and oriented x4. Pt is ambulatory. States feeling drowsy. Pt instructed to call out for additional questions or concerns. RN instructed pt to wait until SW has come by before leaving.

## 2021-06-13 NOTE — Clinical Social Work Maternal (Signed)
CLINICAL SOCIAL WORK MATERNAL/CHILD NOTE  Patient Details  Name: Cheryl Frank MRN: [id number] Date of Birth: 08/31/2001  Date:  05-May-2021  Clinical Social Worker Initiating Note:  Abundio Miu, [place] Date/Time: Initiated:  06/13/21/1306     Child's Name:  Cheryl Frank   Biological Parents:  Mother, Father (Father: Kaylei Frink)   Need for Interpreter:  None   Reason for Referral:  Other (Comment) (NICU Admission; Housing Concerns)   Address:  [place] [address]    Phone number:  [phone] (home)     Additional phone number:   Household Members/Support Persons (HM/SP):   Household Member/Support Person 1, Household Member/Support Person 2, Household Member/Support Person 3   HM/SP Name Relationship DOB or Age  HM/SP -1 Cody Rhoten FOB    HM/SP -2 Carlis Abbott Rhoten son 07/23/19  HM/SP -3 Carlyle Lipa Rhoten son 10/19/18  HM/SP -4        HM/SP -5        HM/SP -6        HM/SP -7        HM/SP -8          Natural Supports (not living in the home):      Professional Supports: None   Employment: Unemployed   Type of Work:     Education:  9 to 11 years (10th Grade)   Homebound arranged: No  Financial Resources:  [employer]   Other Resources:  Physicist, medical  , [place] Considerations Which May Impact Care:    Strengths:  Ability to meet basic needs  , Understanding of illness, Home prepared for child  , Psychotropic Medications   Psychotropic Medications:  Celexa      Pediatrician:       Pediatrician List:   [place]      Pediatrician Fax Number:    Risk Factors/Current Problems:  Mental Health Concerns  , Other (Comment) (Housing Concerns)   Cognitive State:  Alert  , Able to Concentrate  , Insightful  , Goal Oriented  , Linear Thinking     Mood/Affect:  Calm  , Happy  , Comfortable  , Interested     CSW  Assessment: CSW met with MOB at bedside to complete psychosocial assessment, FOB present. MOB and FOB were asleep, CSW woke parents up. CSW introduced self and explained role. MOB reported that she resides with FOB and two older children. MOB reported that she receives both [facility] and food stamps. MOB reported that she has all items needed to care for infant including a car seat and basinet. MOB reported that additional diapers and wipes would be helpful. CSW informed MOB about [employer], MOB agreeable to referral. CSW agreed to make referral for diapers and wipes. CSW inquired about MOB's support system, MOB reported FOB is her only support. MOB shared that FOB's brother's girlfriend is currently caring for older children. CSW and MOB discussed housing concerns. MOB shared that she is currently renting a home from a family member that is going into foreclosure. MOB uncertain about the time frame. MOB reported that she plans to return to her home at discharge and work on securing new housing. CSW provided MOB with housing and shelter resources. CSW encouraged MOB to also follow up with her local DSS agency.   CSW provided review  of Sudden Infant Death Syndrome (SIDS) precautions.    FOB reported that he would be back and left the room.   CSW inquired about MOB's mental health history. MOB reported that she was diagnosed with Bipolar Disorder, Anxiety, and Depression 2-3 months ago at [facility]. MOB denied any current Bipolar Disorder symptoms and was unable to recall which symptoms she was experiencing that led to that diagnosis. MOB described her depression as loss of interest, isolating, being tearful, and sad. MOB denied any current depression/anxiety symptoms. MOB reported that she is currently taking Celexa which is helpful. MOB endorsed experiencing baby blues after previous pregnancies. CSW inquired about how MOB was feeling emotionally after giving  birth, MOB described being in awe and her pregnancy feeling surreal, noting that the delivery went really fast having infant in the EMS. MOB presented calm and did not demonstrate any acute mental health signs/symptoms. CSW assessed for safety, MOB denied SI, HI and domestic violence.   CSW provided education regarding the baby blues period vs. perinatal mood disorders, discussed treatment and gave resources for mental health follow up if concerns arise.  CSW recommends self-evaluation during the postpartum time period using the New Mom Checklist from Postpartum Progress and encouraged MOB to contact a medical professional if symptoms are noted at any time.    FOB came back into the room.   CSW and MOB discussed infant's NICU admission. CSW informed parents about the NICU, what to expect and resources/supports available while infant is admitted to the NICU. MOB reported that she feels well informed about infant's care. MOB denied transportation barriers with visiting infant in the NICU but shared that gas may be a lot. CSW agreed to place gas cards at infant's bedside. MOB reported that meal vouchers will also be helpful. CSW agreed to place meal vouchers at infant's bedside. MOB denied any questions/concerns regarding the NICU.   CSW will continue to offer resources/supports while infant is admitted to the NICU.   CSW made referral to [facility] for requested items.   CSW Plan/Description:  Psychosocial Support and Ongoing Assessment of Needs, Sudden Infant Death Syndrome (SIDS) Education, Perinatal Mood and Anxiety Disorder (PMADs) Education, Other Information/Referral to Community Resources, Other Patient/Family Education    Denaly Gatling L Harue Pribble, LCSW 06/13/2021, 1:09 PM 

## 2021-06-13 NOTE — Plan of Care (Signed)
  Problem: Education: Goal: Knowledge of General Education information will improve Description: Including pain rating scale, medication(s)/side effects and non-pharmacologic comfort measures Outcome: Adequate for Discharge   Problem: Health Behavior/Discharge Planning: Goal: Ability to manage health-related needs will improve Outcome: Adequate for Discharge   Problem: Clinical Measurements: Goal: Ability to maintain clinical measurements within normal limits will improve Outcome: Adequate for Discharge Goal: Will remain free from infection Outcome: Adequate for Discharge Goal: Cardiovascular complication will be avoided Outcome: Adequate for Discharge   Problem: Activity: Goal: Risk for activity intolerance will decrease Outcome: Adequate for Discharge   Problem: Nutrition: Goal: Adequate nutrition will be maintained Outcome: Adequate for Discharge   Problem: Coping: Goal: Level of anxiety will decrease Outcome: Adequate for Discharge   Problem: Elimination: Goal: Will not experience complications related to bowel motility Outcome: Adequate for Discharge Goal: Will not experience complications related to urinary retention Outcome: Adequate for Discharge   Problem: Pain Managment: Goal: General experience of comfort will improve Outcome: Adequate for Discharge   Problem: Safety: Goal: Ability to remain free from injury will improve Outcome: Adequate for Discharge   Problem: Skin Integrity: Goal: Risk for impaired skin integrity will decrease Outcome: Adequate for Discharge   Problem: Education: Goal: Knowledge of condition will improve Outcome: Adequate for Discharge Goal: Individualized Educational Video(s) Outcome: Adequate for Discharge Goal: Individualized Newborn Educational Video(s) Outcome: Adequate for Discharge   Problem: Activity: Goal: Will verbalize the importance of balancing activity with adequate rest periods Outcome: Adequate for  Discharge Goal: Ability to tolerate increased activity will improve Outcome: Adequate for Discharge   Problem: Coping: Goal: Ability to identify and utilize available resources and services will improve Outcome: Adequate for Discharge   Problem: Life Cycle: Goal: Chance of risk for complications during the postpartum period will decrease Outcome: Adequate for Discharge   Problem: Role Relationship: Goal: Ability to demonstrate positive interaction with newborn will improve Outcome: Adequate for Discharge   Problem: Skin Integrity: Goal: Demonstration of wound healing without infection will improve Outcome: Adequate for Discharge

## 2021-06-14 ENCOUNTER — Telehealth: Payer: Self-pay

## 2021-06-14 NOTE — Telephone Encounter (Signed)
Transition Care Management Unsuccessful Follow-up Telephone Call  Date of discharge and from where:  06/13/2021-[facility]   Attempts:  1st Attempt  Reason for unsuccessful TCM follow-up call:  Unable to reach patient

## 2021-06-15 ENCOUNTER — Encounter: Payer: Self-pay | Admitting: *Deleted

## 2021-06-15 ENCOUNTER — Ambulatory Visit: Payer: Medicaid Other

## 2021-06-15 NOTE — Telephone Encounter (Signed)
Transition Care Management Unsuccessful Follow-up Telephone Call  Date of discharge and from where:  06/13/2021-[facility]   Attempts:  2nd Attempt  Reason for unsuccessful TCM follow-up call:  No answer/busy

## 2021-06-16 ENCOUNTER — Telehealth: Payer: Medicaid Other | Admitting: Family Medicine

## 2021-06-16 NOTE — Telephone Encounter (Signed)
Transition Care Management Unsuccessful Follow-up Telephone Call  Date of discharge and from where:  06/13/2021-[facility]  Attempts:  3rd Attempt  Reason for unsuccessful TCM follow-up call:  No answer/busy

## 2021-06-29 ENCOUNTER — Ambulatory Visit: Payer: Medicaid Other

## 2021-06-29 ENCOUNTER — Telehealth: Payer: Medicaid Other | Admitting: Advanced Practice Midwife

## 2021-07-03 ENCOUNTER — Ambulatory Visit: Payer: Medicaid Other | Admitting: Obstetrics & Gynecology

## 2021-07-06 DIAGNOSIS — Z419 Encounter for procedure for purposes other than remedying health state, unspecified: Secondary | ICD-10-CM | POA: Diagnosis not present

## 2021-07-18 ENCOUNTER — Ambulatory Visit: Payer: Medicaid Other | Admitting: Women's Health

## 2021-08-04 ENCOUNTER — Other Ambulatory Visit: Payer: Self-pay | Admitting: Family Medicine

## 2021-08-04 ENCOUNTER — Telehealth: Payer: Self-pay

## 2021-08-04 DIAGNOSIS — J4530 Mild persistent asthma, uncomplicated: Secondary | ICD-10-CM

## 2021-08-04 NOTE — Telephone Encounter (Signed)
Returned pt's call for clarification. Two identifiers used. Pt stated that she's been having lots of leg cramps with bursted blood vessels. Pt staying hydrated and wearing compression stockings. Pt has still not had pp visit. Pt scheduled for the first available. Will address at visit, unless she can be seen by her PCP before then. Pt confirmed understanding.

## 2021-08-04 NOTE — Telephone Encounter (Signed)
-----   Message from Izora Gala sent at 08/04/2021  9:58 AM EDT ----- Regarding: inhaler and leg cramps The pt stated that shes experiencing leg cramps since her daughter has been discharged from the hospital and she needs an inhaler (I told her that shell probably need to call her PCP about this one) and she said you can call her at [phone]

## 2021-08-05 DIAGNOSIS — Z419 Encounter for procedure for purposes other than remedying health state, unspecified: Secondary | ICD-10-CM | POA: Diagnosis not present

## 2021-08-11 DIAGNOSIS — R0902 Hypoxemia: Secondary | ICD-10-CM | POA: Diagnosis not present

## 2021-08-11 DIAGNOSIS — F439 Reaction to severe stress, unspecified: Secondary | ICD-10-CM | POA: Diagnosis not present

## 2021-08-11 DIAGNOSIS — S93401A Sprain of unspecified ligament of right ankle, initial encounter: Secondary | ICD-10-CM | POA: Diagnosis not present

## 2021-08-11 DIAGNOSIS — R404 Transient alteration of awareness: Secondary | ICD-10-CM | POA: Diagnosis not present

## 2021-08-11 DIAGNOSIS — S99911A Unspecified injury of right ankle, initial encounter: Secondary | ICD-10-CM | POA: Diagnosis not present

## 2021-08-11 DIAGNOSIS — R569 Unspecified convulsions: Secondary | ICD-10-CM | POA: Diagnosis not present

## 2021-08-11 DIAGNOSIS — Z79818 Long term (current) use of other agents affecting estrogen receptors and estrogen levels: Secondary | ICD-10-CM | POA: Diagnosis not present

## 2021-08-11 DIAGNOSIS — F53 Postpartum depression: Secondary | ICD-10-CM | POA: Diagnosis not present

## 2021-08-11 DIAGNOSIS — O99345 Other mental disorders complicating the puerperium: Secondary | ICD-10-CM | POA: Diagnosis not present

## 2021-08-11 DIAGNOSIS — F32A Depression, unspecified: Secondary | ICD-10-CM | POA: Diagnosis not present

## 2021-08-11 DIAGNOSIS — Z743 Need for continuous supervision: Secondary | ICD-10-CM | POA: Diagnosis not present

## 2021-08-11 DIAGNOSIS — X58XXXA Exposure to other specified factors, initial encounter: Secondary | ICD-10-CM | POA: Diagnosis not present

## 2021-08-11 DIAGNOSIS — E876 Hypokalemia: Secondary | ICD-10-CM | POA: Diagnosis not present

## 2021-08-11 DIAGNOSIS — F419 Anxiety disorder, unspecified: Secondary | ICD-10-CM | POA: Diagnosis not present

## 2021-08-11 DIAGNOSIS — Z79899 Other long term (current) drug therapy: Secondary | ICD-10-CM | POA: Diagnosis not present

## 2021-08-21 ENCOUNTER — Ambulatory Visit: Payer: Medicaid Other | Admitting: Women's Health

## 2021-09-05 DIAGNOSIS — Z419 Encounter for procedure for purposes other than remedying health state, unspecified: Secondary | ICD-10-CM | POA: Diagnosis not present

## 2021-09-21 ENCOUNTER — Ambulatory Visit: Payer: Medicaid Other | Admitting: Family Medicine

## 2021-09-22 ENCOUNTER — Encounter: Payer: Self-pay | Admitting: Family Medicine

## 2021-09-26 ENCOUNTER — Ambulatory Visit: Payer: Medicaid Other | Admitting: Family Medicine

## 2021-09-26 ENCOUNTER — Encounter: Payer: Self-pay | Admitting: Family Medicine

## 2021-10-05 DIAGNOSIS — Z419 Encounter for procedure for purposes other than remedying health state, unspecified: Secondary | ICD-10-CM | POA: Diagnosis not present

## 2021-10-24 DIAGNOSIS — R112 Nausea with vomiting, unspecified: Secondary | ICD-10-CM | POA: Diagnosis not present

## 2021-10-24 DIAGNOSIS — J029 Acute pharyngitis, unspecified: Secondary | ICD-10-CM | POA: Diagnosis not present

## 2021-10-24 DIAGNOSIS — R519 Headache, unspecified: Secondary | ICD-10-CM | POA: Diagnosis not present

## 2021-10-24 DIAGNOSIS — J020 Streptococcal pharyngitis: Secondary | ICD-10-CM | POA: Diagnosis not present

## 2021-10-31 DIAGNOSIS — R059 Cough, unspecified: Secondary | ICD-10-CM | POA: Diagnosis not present

## 2021-10-31 DIAGNOSIS — R0981 Nasal congestion: Secondary | ICD-10-CM | POA: Diagnosis not present

## 2021-10-31 DIAGNOSIS — J029 Acute pharyngitis, unspecified: Secondary | ICD-10-CM | POA: Diagnosis not present

## 2021-10-31 DIAGNOSIS — J329 Chronic sinusitis, unspecified: Secondary | ICD-10-CM | POA: Diagnosis not present

## 2021-11-05 DIAGNOSIS — Z419 Encounter for procedure for purposes other than remedying health state, unspecified: Secondary | ICD-10-CM | POA: Diagnosis not present

## 2021-11-14 DIAGNOSIS — H5213 Myopia, bilateral: Secondary | ICD-10-CM | POA: Diagnosis not present

## 2021-11-21 DIAGNOSIS — F4323 Adjustment disorder with mixed anxiety and depressed mood: Secondary | ICD-10-CM | POA: Diagnosis not present

## 2021-12-06 DIAGNOSIS — Z419 Encounter for procedure for purposes other than remedying health state, unspecified: Secondary | ICD-10-CM | POA: Diagnosis not present

## 2022-01-03 DIAGNOSIS — Z419 Encounter for procedure for purposes other than remedying health state, unspecified: Secondary | ICD-10-CM | POA: Diagnosis not present

## 2022-02-03 DIAGNOSIS — Z419 Encounter for procedure for purposes other than remedying health state, unspecified: Secondary | ICD-10-CM | POA: Diagnosis not present

## 2022-02-22 DIAGNOSIS — H10232 Serous conjunctivitis, except viral, left eye: Secondary | ICD-10-CM | POA: Diagnosis not present

## 2022-02-22 DIAGNOSIS — R070 Pain in throat: Secondary | ICD-10-CM | POA: Diagnosis not present

## 2022-02-22 DIAGNOSIS — J0100 Acute maxillary sinusitis, unspecified: Secondary | ICD-10-CM | POA: Diagnosis not present

## 2022-02-22 DIAGNOSIS — N912 Amenorrhea, unspecified: Secondary | ICD-10-CM | POA: Diagnosis not present

## 2022-03-05 DIAGNOSIS — Z419 Encounter for procedure for purposes other than remedying health state, unspecified: Secondary | ICD-10-CM | POA: Diagnosis not present

## 2022-04-05 DIAGNOSIS — Z419 Encounter for procedure for purposes other than remedying health state, unspecified: Secondary | ICD-10-CM | POA: Diagnosis not present

## 2022-04-16 DIAGNOSIS — F419 Anxiety disorder, unspecified: Secondary | ICD-10-CM | POA: Diagnosis not present

## 2022-04-16 DIAGNOSIS — R000 Tachycardia, unspecified: Secondary | ICD-10-CM | POA: Diagnosis not present

## 2022-04-16 DIAGNOSIS — F424 Excoriation (skin-picking) disorder: Secondary | ICD-10-CM | POA: Diagnosis not present

## 2022-04-30 DIAGNOSIS — Z9049 Acquired absence of other specified parts of digestive tract: Secondary | ICD-10-CM | POA: Diagnosis not present

## 2022-04-30 DIAGNOSIS — Z3A00 Weeks of gestation of pregnancy not specified: Secondary | ICD-10-CM | POA: Diagnosis not present

## 2022-04-30 DIAGNOSIS — Z880 Allergy status to penicillin: Secondary | ICD-10-CM | POA: Diagnosis not present

## 2022-04-30 DIAGNOSIS — Z3A26 26 weeks gestation of pregnancy: Secondary | ICD-10-CM | POA: Diagnosis not present

## 2022-05-05 DIAGNOSIS — Z419 Encounter for procedure for purposes other than remedying health state, unspecified: Secondary | ICD-10-CM | POA: Diagnosis not present

## 2022-06-05 DIAGNOSIS — Z419 Encounter for procedure for purposes other than remedying health state, unspecified: Secondary | ICD-10-CM | POA: Diagnosis not present

## 2022-07-06 DIAGNOSIS — Z419 Encounter for procedure for purposes other than remedying health state, unspecified: Secondary | ICD-10-CM | POA: Diagnosis not present

## 2022-07-08 DIAGNOSIS — F4323 Adjustment disorder with mixed anxiety and depressed mood: Secondary | ICD-10-CM | POA: Diagnosis not present

## 2022-08-05 DIAGNOSIS — Z419 Encounter for procedure for purposes other than remedying health state, unspecified: Secondary | ICD-10-CM | POA: Diagnosis not present

## 2022-08-13 ENCOUNTER — Encounter: Payer: Self-pay | Admitting: Family Medicine

## 2022-08-13 ENCOUNTER — Ambulatory Visit: Payer: Medicaid Other | Admitting: Family Medicine

## 2022-08-15 ENCOUNTER — Encounter: Payer: Self-pay | Admitting: Family Medicine

## 2022-08-15 ENCOUNTER — Ambulatory Visit: Payer: Medicaid Other | Admitting: Family Medicine

## 2022-09-05 DIAGNOSIS — Z419 Encounter for procedure for purposes other than remedying health state, unspecified: Secondary | ICD-10-CM | POA: Diagnosis not present

## 2022-10-05 DIAGNOSIS — Z419 Encounter for procedure for purposes other than remedying health state, unspecified: Secondary | ICD-10-CM | POA: Diagnosis not present

## 2022-11-05 DIAGNOSIS — Z419 Encounter for procedure for purposes other than remedying health state, unspecified: Secondary | ICD-10-CM | POA: Diagnosis not present

## 2022-12-06 DIAGNOSIS — Z419 Encounter for procedure for purposes other than remedying health state, unspecified: Secondary | ICD-10-CM | POA: Diagnosis not present

## 2023-01-04 DIAGNOSIS — Z419 Encounter for procedure for purposes other than remedying health state, unspecified: Secondary | ICD-10-CM | POA: Diagnosis not present

## 2023-02-04 DIAGNOSIS — Z419 Encounter for procedure for purposes other than remedying health state, unspecified: Secondary | ICD-10-CM | POA: Diagnosis not present

## 2023-02-22 ENCOUNTER — Telehealth: Payer: Self-pay | Admitting: Family Medicine

## 2023-02-22 NOTE — Telephone Encounter (Signed)
LVM for patient to call back to schedule apt with PCP. AS, CMA 

## 2023-03-06 DIAGNOSIS — Z419 Encounter for procedure for purposes other than remedying health state, unspecified: Secondary | ICD-10-CM | POA: Diagnosis not present

## 2023-04-06 DIAGNOSIS — Z419 Encounter for procedure for purposes other than remedying health state, unspecified: Secondary | ICD-10-CM | POA: Diagnosis not present

## 2023-05-06 DIAGNOSIS — Z419 Encounter for procedure for purposes other than remedying health state, unspecified: Secondary | ICD-10-CM | POA: Diagnosis not present

## 2023-06-06 DIAGNOSIS — Z419 Encounter for procedure for purposes other than remedying health state, unspecified: Secondary | ICD-10-CM | POA: Diagnosis not present

## 2023-06-28 DIAGNOSIS — R21 Rash and other nonspecific skin eruption: Secondary | ICD-10-CM | POA: Diagnosis not present

## 2023-06-28 DIAGNOSIS — W57XXXA Bitten or stung by nonvenomous insect and other nonvenomous arthropods, initial encounter: Secondary | ICD-10-CM | POA: Diagnosis not present

## 2023-07-02 ENCOUNTER — Telehealth: Payer: Self-pay

## 2023-07-02 NOTE — Telephone Encounter (Signed)
LVM for patient to call back [phone], or to call PCP office to schedule follow up apt. AS, CMA  

## 2023-07-07 DIAGNOSIS — Z419 Encounter for procedure for purposes other than remedying health state, unspecified: Secondary | ICD-10-CM | POA: Diagnosis not present

## 2023-07-19 ENCOUNTER — Encounter (HOSPITAL_COMMUNITY): Payer: Self-pay | Admitting: Emergency Medicine

## 2023-07-19 ENCOUNTER — Other Ambulatory Visit: Payer: Self-pay

## 2023-07-19 ENCOUNTER — Emergency Department (HOSPITAL_COMMUNITY)
Admission: EM | Admit: 2023-07-19 | Discharge: 2023-07-19 | Payer: Medicaid Other | Attending: Emergency Medicine | Admitting: Emergency Medicine

## 2023-07-19 DIAGNOSIS — W448XXA Other foreign body entering into or through a natural orifice, initial encounter: Secondary | ICD-10-CM | POA: Insufficient documentation

## 2023-07-19 DIAGNOSIS — T192XXA Foreign body in vulva and vagina, initial encounter: Secondary | ICD-10-CM | POA: Insufficient documentation

## 2023-07-19 NOTE — ED Triage Notes (Signed)
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

## 2023-07-19 NOTE — Discharge Instructions (Signed)
Return to the ER if you experience any new or concerning symptoms.

## 2023-07-19 NOTE — ED Provider Notes (Signed)
  [place] EMERGENCY DEPARTMENT AT [facility] Provider Note   CSN: [id number] Arrival date & time: 07/19/23  0012     History  No chief complaint on file.   Cheryl Frank is a 22 y.o. female.  Patient is a 22 year old female with history of substance abuse.  Patient brought by law enforcement for possible vaginal foreign body.  Patient apparently keeps her drugs in a Ziploc bag in her vagina for safekeeping.  She was arrested on outstanding warrants.  While at the jail, the bag fell out of her vagina and was empty.  She was brought here for medical clearance and rule out of additional foreign bodies.  The history is provided by the patient.       Home Medications Prior to Admission medications   Medication Sig Start Date End Date Taking? Authorizing Provider  citalopram (CELEXA) 40 MG tablet Take 1 tablet (40 mg total) by mouth daily. 05/30/21 08/28/21  Myna Hidalgo, DO  fluticasone (FLOVENT DISKUS) 50 MCG/BLIST diskus inhaler Inhale 1 puff into the lungs 2 (two) times daily. 10/31/20   [name], FNP  ibuprofen (ADVIL) 600 MG tablet Take 1 tablet (600 mg total) by mouth every 6 (six) hours. 06/13/21   Hermina Staggers, MD  Prenat w/o [id number] [facility] ASSURE) 35-1 & 300 MG tablet One tablet and one capsule daily 01/04/21   [name], CNM  PROAIR HFA [phone] Base) MCG/ACT inhaler INHALE 2 PUFFS BY MOUTH EVERY 6 HOURS AS NEEDED 08/04/21   Gwenlyn Fudge, FNP      Allergies    Penicillins    Review of Systems   Review of Systems  All other systems reviewed and are negative.   Physical Exam Updated Vital Signs BP 101/60 (BP Location: Right Arm)   Pulse 90   Temp 98.8 F (37.1 C) (Oral)   Resp 17   Ht 5\' 2"  (1.575 m)   Wt 61.2 kg   LMP 07/15/2023   SpO2 92%   BMI 24.69 kg/m  Physical Exam Vitals and nursing note reviewed.  Constitutional:      Appearance: Normal appearance.  Pulmonary:     Effort: Pulmonary effort is  normal.  Genitourinary:    Comments: Speculum exam reveals no vaginal foreign body. Skin:    General: Skin is warm and dry.  Neurological:     Mental Status: She is alert and oriented to person, place, and time.     ED Results / Procedures / Treatments   Labs (all labs ordered are listed, but only abnormal results are displayed) Labs Reviewed - No data to display  EKG None  Radiology No results found.  Procedures Procedures    Medications Ordered in ED Medications - No data to display  ED Course/ Medical Decision Making/ A&P  Patient brought by law enforcement for medical clearance and rule out of vaginal foreign body.  Speculum exam reveals nothing in the vagina.  Patient alert and oriented and I feel can safely be discharged.  Final Clinical Impression(s) / ED Diagnoses Final diagnoses:  None    Rx / DC Orders ED Discharge Orders     None         Geoffery Lyons, MD 07/19/23 [phone]

## 2023-08-06 DIAGNOSIS — Z419 Encounter for procedure for purposes other than remedying health state, unspecified: Secondary | ICD-10-CM | POA: Diagnosis not present

## 2023-09-06 DIAGNOSIS — Z419 Encounter for procedure for purposes other than remedying health state, unspecified: Secondary | ICD-10-CM | POA: Diagnosis not present

## 2023-10-06 DIAGNOSIS — Z419 Encounter for procedure for purposes other than remedying health state, unspecified: Secondary | ICD-10-CM | POA: Diagnosis not present

## 2023-11-06 DIAGNOSIS — Z419 Encounter for procedure for purposes other than remedying health state, unspecified: Secondary | ICD-10-CM | POA: Diagnosis not present

## 2023-12-07 DIAGNOSIS — Z419 Encounter for procedure for purposes other than remedying health state, unspecified: Secondary | ICD-10-CM | POA: Diagnosis not present

## 2024-01-04 DIAGNOSIS — Z419 Encounter for procedure for purposes other than remedying health state, unspecified: Secondary | ICD-10-CM | POA: Diagnosis not present

## 2024-01-15 DIAGNOSIS — Z3A01 Less than 8 weeks gestation of pregnancy: Secondary | ICD-10-CM | POA: Diagnosis not present

## 2024-01-15 DIAGNOSIS — R112 Nausea with vomiting, unspecified: Secondary | ICD-10-CM | POA: Diagnosis not present

## 2024-01-15 DIAGNOSIS — L989 Disorder of the skin and subcutaneous tissue, unspecified: Secondary | ICD-10-CM | POA: Diagnosis not present

## 2024-01-15 DIAGNOSIS — R300 Dysuria: Secondary | ICD-10-CM | POA: Diagnosis not present

## 2024-01-15 DIAGNOSIS — Z113 Encounter for screening for infections with a predominantly sexual mode of transmission: Secondary | ICD-10-CM | POA: Diagnosis not present

## 2024-01-15 DIAGNOSIS — R000 Tachycardia, unspecified: Secondary | ICD-10-CM | POA: Diagnosis not present

## 2024-01-15 DIAGNOSIS — Z8709 Personal history of other diseases of the respiratory system: Secondary | ICD-10-CM | POA: Diagnosis not present

## 2024-01-17 DIAGNOSIS — L02413 Cutaneous abscess of right upper limb: Secondary | ICD-10-CM | POA: Diagnosis not present

## 2024-02-15 DIAGNOSIS — Z419 Encounter for procedure for purposes other than remedying health state, unspecified: Secondary | ICD-10-CM | POA: Diagnosis not present

## 2024-03-10 DIAGNOSIS — Z331 Pregnant state, incidental: Secondary | ICD-10-CM | POA: Diagnosis not present

## 2024-03-11 DIAGNOSIS — Z331 Pregnant state, incidental: Secondary | ICD-10-CM | POA: Diagnosis not present

## 2024-03-14 DIAGNOSIS — Z331 Pregnant state, incidental: Secondary | ICD-10-CM | POA: Diagnosis not present

## 2024-03-16 DIAGNOSIS — Z419 Encounter for procedure for purposes other than remedying health state, unspecified: Secondary | ICD-10-CM | POA: Diagnosis not present

## 2024-03-20 DIAGNOSIS — Z331 Pregnant state, incidental: Secondary | ICD-10-CM | POA: Diagnosis not present

## 2024-03-21 DIAGNOSIS — Z331 Pregnant state, incidental: Secondary | ICD-10-CM | POA: Diagnosis not present

## 2024-03-21 DIAGNOSIS — H5213 Myopia, bilateral: Secondary | ICD-10-CM | POA: Diagnosis not present

## 2024-03-27 DIAGNOSIS — Z331 Pregnant state, incidental: Secondary | ICD-10-CM | POA: Diagnosis not present

## 2024-03-28 DIAGNOSIS — Z331 Pregnant state, incidental: Secondary | ICD-10-CM | POA: Diagnosis not present

## 2024-03-31 ENCOUNTER — Other Ambulatory Visit: Payer: Self-pay | Admitting: Obstetrics & Gynecology

## 2024-03-31 DIAGNOSIS — Z789 Other specified health status: Secondary | ICD-10-CM

## 2024-03-31 DIAGNOSIS — O3680X0 Pregnancy with inconclusive fetal viability, not applicable or unspecified: Secondary | ICD-10-CM

## 2024-04-01 ENCOUNTER — Other Ambulatory Visit: Admitting: Radiology

## 2024-04-03 DIAGNOSIS — Z331 Pregnant state, incidental: Secondary | ICD-10-CM | POA: Diagnosis not present

## 2024-04-04 DIAGNOSIS — Z331 Pregnant state, incidental: Secondary | ICD-10-CM | POA: Diagnosis not present

## 2024-04-07 ENCOUNTER — Other Ambulatory Visit: Payer: Self-pay | Admitting: Obstetrics & Gynecology

## 2024-04-07 ENCOUNTER — Ambulatory Visit: Admitting: Radiology

## 2024-04-07 DIAGNOSIS — O3680X0 Pregnancy with inconclusive fetal viability, not applicable or unspecified: Secondary | ICD-10-CM

## 2024-04-07 DIAGNOSIS — Z3A00 Weeks of gestation of pregnancy not specified: Secondary | ICD-10-CM | POA: Diagnosis not present

## 2024-04-07 DIAGNOSIS — Z789 Other specified health status: Secondary | ICD-10-CM

## 2024-04-07 DIAGNOSIS — O3680X2 Pregnancy with inconclusive fetal viability, fetus 2: Secondary | ICD-10-CM

## 2024-04-07 DIAGNOSIS — O09891 Supervision of other high risk pregnancies, first trimester: Secondary | ICD-10-CM | POA: Diagnosis not present

## 2024-04-07 DIAGNOSIS — O0930 Supervision of pregnancy with insufficient antenatal care, unspecified trimester: Secondary | ICD-10-CM

## 2024-04-07 DIAGNOSIS — O09899 Supervision of other high risk pregnancies, unspecified trimester: Secondary | ICD-10-CM

## 2024-04-07 NOTE — Progress Notes (Signed)
 US : GA = unknown  -  LMP unknown Single active female fetus, cephalic, FHR = 137 bpm, AFI = 16.7 cm, MVP = 5.5 cm, fundal posterior pl, gr1   EDD by U/S today = 07-12-24

## 2024-04-09 DIAGNOSIS — Z331 Pregnant state, incidental: Secondary | ICD-10-CM | POA: Diagnosis not present

## 2024-04-11 DIAGNOSIS — Z331 Pregnant state, incidental: Secondary | ICD-10-CM | POA: Diagnosis not present

## 2024-04-14 ENCOUNTER — Other Ambulatory Visit: Payer: Self-pay | Admitting: Obstetrics & Gynecology

## 2024-04-14 DIAGNOSIS — Z363 Encounter for antenatal screening for malformations: Secondary | ICD-10-CM

## 2024-04-15 ENCOUNTER — Encounter: Payer: Self-pay | Admitting: *Deleted

## 2024-04-16 ENCOUNTER — Encounter: Admitting: *Deleted

## 2024-04-16 ENCOUNTER — Encounter: Admitting: Advanced Practice Midwife

## 2024-04-16 ENCOUNTER — Other Ambulatory Visit

## 2024-04-20 ENCOUNTER — Telehealth: Payer: Self-pay

## 2024-04-20 NOTE — Telephone Encounter (Signed)
 Called patient to see if could get rescheduled no answer but I was able to leave voice mail.

## 2024-04-29 ENCOUNTER — Encounter: Admitting: Women's Health

## 2024-04-29 ENCOUNTER — Encounter: Admitting: *Deleted

## 2024-05-20 ENCOUNTER — Other Ambulatory Visit (HOSPITAL_COMMUNITY)
Admission: RE | Admit: 2024-05-20 | Discharge: 2024-05-20 | Disposition: A | Discharge status: Home / Self Care | Payer: MEDICAID | Source: Ambulatory Visit | Attending: Advanced Practice Midwife | Admitting: Advanced Practice Midwife

## 2024-05-20 ENCOUNTER — Ambulatory Visit: Payer: MEDICAID | Admitting: Advanced Practice Midwife

## 2024-05-20 ENCOUNTER — Encounter: Payer: MEDICAID | Admitting: *Deleted

## 2024-05-20 ENCOUNTER — Other Ambulatory Visit: Payer: Self-pay

## 2024-05-20 VITALS — BP 104/70 | HR 92 | Wt 169.0 lb

## 2024-05-20 DIAGNOSIS — Z3A34 34 weeks gestation of pregnancy: Secondary | ICD-10-CM

## 2024-05-20 DIAGNOSIS — Z3480 Encounter for supervision of other normal pregnancy, unspecified trimester: Secondary | ICD-10-CM | POA: Diagnosis present

## 2024-05-20 DIAGNOSIS — Z3A32 32 weeks gestation of pregnancy: Secondary | ICD-10-CM | POA: Diagnosis not present

## 2024-05-20 DIAGNOSIS — Z3483 Encounter for supervision of other normal pregnancy, third trimester: Secondary | ICD-10-CM | POA: Diagnosis not present

## 2024-05-20 DIAGNOSIS — F1120 Opioid dependence, uncomplicated: Secondary | ICD-10-CM

## 2024-05-20 DIAGNOSIS — Z8619 Personal history of other infectious and parasitic diseases: Secondary | ICD-10-CM | POA: Diagnosis not present

## 2024-05-20 DIAGNOSIS — Z131 Encounter for screening for diabetes mellitus: Secondary | ICD-10-CM

## 2024-05-20 NOTE — Progress Notes (Signed)
 INITIAL OBSTETRICAL VISIT Patient name: Cheryl Frank MRN [id number]  Date of birth: [birth year] Chief Complaint:   Initial Prenatal Visit  History of Present Illness:   Cheryl Frank is a 23 y.o. [phone] Caucasian female at [redacted]w[redacted]d by US  at 26.2 weeks with an Estimated Date of Delivery: 07/12/24 being seen today for her initial obstetrical visit.   Patient's last menstrual period was 07/15/2023. Her obstetrical history is significant for G1 term vag delivery; G2 33wk PPROM w preterm vag delivery; G3 36wk preterm delivery.   Today she reports no complaints.  Last pap March 2022. Results were: NILM w/ HRHPV not done     05/20/2024    3:51 PM 01/04/2021   12:02 PM 10/31/2020    1:06 PM 07/13/2020    4:28 PM 12/30/2017    5:04 PM  Depression screen PHQ 2/9  Decreased Interest 1 1 1 1  0  Down, Depressed, Hopeless 1 2 1 2  0  PHQ - 2 Score 2 3 2 3  0  Altered sleeping 3 1 0 1   Tired, decreased energy 3 3 1 2    Change in appetite 1 1 1 2    Feeling bad or failure about yourself  0 0 1 1   Trouble concentrating 1 1 1 3    Moving slowly or fidgety/restless 0 1 1 1    Suicidal thoughts 0 0 0 0   PHQ-9 Score 10 10 7 13    Difficult doing work/chores   Somewhat difficult          05/20/2024    3:51 PM 01/04/2021   12:03 PM 10/31/2020    1:08 PM 07/13/2020    4:28 PM  GAD 7 : Generalized Anxiety Score  Nervous, Anxious, on Edge 3 1 1 3   Control/stop worrying 2 1 1 3   Worry too much - different things 3 1 1 3   Trouble relaxing 2 1 1 3   Restless 2 2 1 3   Easily annoyed or irritable 3 1 1 3   Afraid - awful might happen 0 1 1 2   Total GAD 7 Score 15 8 7 20   Anxiety Difficulty   Somewhat difficult      Review of Systems:   Pertinent items are noted in HPI Denies cramping/contractions, leakage of fluid, vaginal bleeding, abnormal vaginal discharge w/ itching/odor/irritation, headaches, visual changes, shortness of breath, chest pain, abdominal pain, severe nausea/vomiting, or problems with  urination or bowel movements unless otherwise stated above.  Pertinent History Reviewed:  Reviewed past medical,surgical, social, obstetrical and family history.  Reviewed problem list, medications and allergies. OB History  Gravida Para Term Preterm AB Living  4 3 1 2  3   SAB IAB Ectopic Multiple Live Births     0 3    # Outcome Date GA Lbr Len/2nd Weight Sex Type Anes PTL Lv  4 Current           3 Preterm 06/11/21 [redacted]w[redacted]d  5 lb 14.5 oz (2.678 kg) F Vag-Spont None  LIV  2 Preterm 07/23/19 [redacted]w[redacted]d  4 lb 3 oz (1.899 kg) M Vag-Spont None N LIV     Complications: Preterm premature rupture of membranes  1 Term 10/19/18 [redacted]w[redacted]d  7 lb 4 oz (3.289 kg) M Vag-Spont EPI N LIV   Physical Assessment:   Vitals:   05/20/24 1522  BP: 104/70  Pulse: 92  Weight: 169 lb (76.7 kg)  Body mass index is 30.91 kg/m.       Physical Examination:  General  appearance - well appearing, and in no distress  Mental status - alert, oriented to person, place, and time  Psych:  She has a normal mood and affect  Skin - warm and dry, normal color, no suspicious lesions noted  Chest - effort normal, all lung fields clear to auscultation bilaterally  Heart - normal rate and regular rhythm  Abdomen - gravid, FH 32cm; FHRs 137 bpm  Extremities:  No swelling or varicosities noted  Pelvic - not indicated  Thin prep pap is not done   TODAY'S NT too late  No results found for this or any previous visit (from the past 24 hours).  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   Initial labs obtained Continue prenatal vitamins Reviewed n/v relief measures and warning s/s to report Reviewed recommended  weight gain based on pre-gravid BMI Encouraged well-balanced diet Genetic & carrier screening discussed: declines Panorama, too late for NT/IT and AFP Ultrasound discussed; fetal survey: requested CCNC completed> form faxed if has or is planning to apply for medicaid The nature of [facility] [employer] with multiple MDs and other Advanced Practice Providers was explained to patient; also emphasized that fellows, residents, and students are part of our team. Does not have home bp cuff. Office bp cuff given: yes. Rx sent: n/a. Check bp weekly, let us  know if consistently >140/90.   No indications for ASA therapy (per uptodate)  Follow-up: Return in about 2 weeks (around 06/03/2024) for 2wk LROB; 4wk LROB with GBS/cultures. ; 2h GTT in 2wks  Orders Placed This Encounter  Procedures   Urine Culture   CBC/D/Plt+RPR+Rh+ABO+RubIgG...   Hepatitis C antibody   Hemoglobin A1c    Suzen JONETTA [facility] 05/20/2024 3:56 PM

## 2024-05-20 NOTE — Patient Instructions (Signed)
Cheryl Frank, thank you for choosing our office today! We appreciate the opportunity to meet your healthcare needs. You may receive a short survey by mail, e-mail, or through [employer]. If you are happy with your care we would appreciate if you could take just a few minutes to complete the survey questions. We read all of your comments and take your feedback very seriously. Thank you again for choosing our office.  Center for [employer] Team at [place] at [facility] ([place], [place] [address]) Entrance C, located off of [place] parking   CLASSES: Go to [email or web address] to register for classes (childbirth, breastfeeding, waterbirth, infant CPR, daddy bootcamp, etc.)  Call the office [phone]) or go to [employer] [facility] if: You begin to have strong, frequent contractions Your water breaks.  Sometimes it is a big gush of fluid, sometimes it is just a trickle that keeps getting your panties wet or running down your legs You have vaginal bleeding.  It is normal to have a small amount of spotting if your cervix was checked.  You don't feel your baby moving like normal.  If you don't, get you something to eat and drink and lay down and focus on feeling your baby move.   If your baby is still not moving like normal, you should call the office or go to [facility].  Call the office [phone]) or go to [facility] hospital for these signs of pre-eclampsia: Severe headache that does not go away with Tylenol Visual changes- seeing spots, double, blurred vision Pain under your right breast or upper abdomen that does not go away with Tums or heartburn medicine Nausea and/or vomiting Severe swelling in your hands, feet, and face   Tdap Vaccine It is recommended that you get the Tdap vaccine during the third trimester of EACH pregnancy to help protect your baby from getting pertussis (whooping cough) 27-36 weeks is the BEST time to do  this so that you can pass the protection on to your baby. During pregnancy is better than after pregnancy, but if you are unable to get it during pregnancy it will be offered at the hospital.  You can get this vaccine with [place], at the health department, your family doctor, or some local pharmacies Everyone who will be around your baby should also be up-to-date on their vaccines before the baby comes. Adults (who are not pregnant) only need 1 dose of Tdap during adulthood.   [facility] Pediatricians/Family Doctors [facility]): [address] [place], [facility]: [address] [place], [phone]                [facility]): [place], [phone] (call to ask if accepting patients) [facility] Department: [address], [place], [place] Pediatricians/Family Doctors [facility]): [place]. [place], [address], [facility]: [address] [place], [place] [facility]: [place], [facility]): [phone] [facility]: [address] [place]: [address] [facility]: [phone], [phone]  Home Blood Pressure Monitoring for Patients   Your provider has recommended that you check your  blood pressure (BP) at least once a week at home. If you do not have a blood pressure cuff at home, one will be provided for you. Contact your provider if you have not received your monitor within 1 week.   Helpful Tips for Accurate Home Blood Pressure Checks  Don't smoke, exercise, or drink caffeine 30 minutes before checking your BP Use the restroom before checking your BP (a full bladder can raise your  pressure) Relax in a comfortable upright chair Feet on the ground Left arm resting comfortably on a flat surface at the level of your heart Legs uncrossed Back supported Sit quietly and don't talk Place the cuff on your bare arm Adjust snuggly, so that only two fingertips can fit between your skin and the top of the cuff Check 2 readings separated by at least one minute Keep a log of your BP readings For a visual, please reference this diagram: http://ccnc.care/bpdiagram  Provider Name: [facility]     Phone: [phone]  Zone 1: ALL CLEAR  Continue to monitor your symptoms:  BP reading is less than 140 (top number) or less than 90 (bottom number)  No right upper stomach pain No headaches or seeing spots No feeling nauseated or throwing up No swelling in face and hands  Zone 2: CAUTION Call your doctor's office for any of the following:  BP reading is greater than 140 (top number) or greater than 90 (bottom number)  Stomach pain under your ribs in the middle or right side Headaches or seeing spots Feeling nauseated or throwing up Swelling in face and hands  Zone 3: EMERGENCY  Seek immediate medical care if you have any of the following:  BP reading is greater than160 (top number) or greater than 110 (bottom number) Severe headaches not improving with Tylenol Serious difficulty catching your breath Any worsening symptoms from Zone 2   Third Trimester of Pregnancy The third trimester is from week 29 through week 42, months 7 through 9. The third trimester is a time when the fetus is growing rapidly. At the end of the ninth month, the fetus is about 20 inches in length and weighs 6-10 pounds.  BODY CHANGES Your body goes through many changes during pregnancy. The changes vary from woman to woman.  Your weight will continue to increase. You can expect to gain 25-35 pounds (11-16 kg) by the end of the pregnancy. You may begin to get stretch marks on your hips, abdomen,  and breasts. You may urinate more often because the fetus is moving lower into your pelvis and pressing on your bladder. You may develop or continue to have heartburn as a result of your pregnancy. You may develop constipation because certain hormones are causing the muscles that push waste through your intestines to slow down. You may develop hemorrhoids or swollen, bulging veins (varicose veins). You may have pelvic pain because of the weight gain and pregnancy hormones relaxing your joints between the bones in your pelvis. Backaches may result from overexertion of the muscles supporting your posture. You may have changes in your hair. These can include thickening of your hair, rapid growth, and changes in texture. Some women also have hair loss during or after pregnancy, or hair that feels dry or thin. Your hair will most likely return to normal after your baby is born. Your breasts will continue to grow and be tender. A yellow discharge may leak from your breasts called colostrum. Your belly button may stick out. You may   feel short of breath because of your expanding uterus. You may notice the fetus "dropping," or moving lower in your abdomen. You may have a bloody mucus discharge. This usually occurs a few days to a week before labor begins. Your cervix becomes thin and soft (effaced) near your due date. WHAT TO EXPECT AT YOUR PRENATAL EXAMS  You will have prenatal exams every 2 weeks until week 36. Then, you will have weekly prenatal exams. During a routine prenatal visit: You will be weighed to make sure you and the fetus are growing normally. Your blood pressure is taken. Your abdomen will be measured to track your baby's growth. The fetal heartbeat will be listened to. Any test results from the previous visit will be discussed. You may have a cervical check near your due date to see if you have effaced. At around 36 weeks, your caregiver will check your cervix. At the same time, your  caregiver will also perform a test on the secretions of the vaginal tissue. This test is to determine if a type of bacteria, Group B streptococcus, is present. Your caregiver will explain this further. Your caregiver may ask you: What your birth plan is. How you are feeling. If you are feeling the baby move. If you have had any abnormal symptoms, such as leaking fluid, bleeding, severe headaches, or abdominal cramping. If you have any questions. Other tests or screenings that may be performed during your third trimester include: Blood tests that check for low iron levels (anemia). Fetal testing to check the health, activity level, and growth of the fetus. Testing is done if you have certain medical conditions or if there are problems during the pregnancy. FALSE LABOR You may feel small, irregular contractions that eventually go away. These are called Braxton Hicks contractions, or false labor. Contractions may last for hours, days, or even weeks before true labor sets in. If contractions come at regular intervals, intensify, or become painful, it is best to be seen by your caregiver.  SIGNS OF LABOR  Menstrual-like cramps. Contractions that are 5 minutes apart or less. Contractions that start on the top of the uterus and spread down to the lower abdomen and back. A sense of increased pelvic pressure or back pain. A watery or bloody mucus discharge that comes from the vagina. If you have any of these signs before the 37th week of pregnancy, call your caregiver right away. You need to go to the hospital to get checked immediately. HOME CARE INSTRUCTIONS  Avoid all smoking, herbs, alcohol, and unprescribed drugs. These chemicals affect the formation and growth of the baby. Follow your caregiver's instructions regarding medicine use. There are medicines that are either safe or unsafe to take during pregnancy. Exercise only as directed by your caregiver. Experiencing uterine cramps is a good sign to  stop exercising. Continue to eat regular, healthy meals. Wear a good support bra for breast tenderness. Do not use hot tubs, steam rooms, or saunas. Wear your seat belt at all times when driving. Avoid raw meat, uncooked cheese, cat litter boxes, and soil used by cats. These carry germs that can cause birth defects in the baby. Take your prenatal vitamins. Try taking a stool softener (if your caregiver approves) if you develop constipation. Eat more high-fiber foods, such as fresh vegetables or fruit and whole grains. Drink plenty of fluids to keep your urine clear or pale yellow. Take warm sitz baths to soothe any pain or discomfort caused by hemorrhoids. Use hemorrhoid cream if   your caregiver approves. If you develop varicose veins, wear support hose. Elevate your feet for 15 minutes, 3-4 times a day. Limit salt in your diet. Avoid heavy lifting, wear low heal shoes, and practice good posture. Rest a lot with your legs elevated if you have leg cramps or low back pain. Visit your dentist if you have not gone during your pregnancy. Use a soft toothbrush to brush your teeth and be gentle when you floss. A sexual relationship may be continued unless your caregiver directs you otherwise. Do not travel far distances unless it is absolutely necessary and only with the approval of your caregiver. Take prenatal classes to understand, practice, and ask questions about the labor and delivery. Make a trial run to the hospital. Pack your hospital bag. Prepare the baby's nursery. Continue to go to all your prenatal visits as directed by your caregiver. SEEK MEDICAL CARE IF: You are unsure if you are in labor or if your water has broken. You have dizziness. You have mild pelvic cramps, pelvic pressure, or nagging pain in your abdominal area. You have persistent nausea, vomiting, or diarrhea. You have a bad smelling vaginal discharge. You have pain with urination. SEEK IMMEDIATE MEDICAL CARE IF:  You  have a fever. You are leaking fluid from your vagina. You have spotting or bleeding from your vagina. You have severe abdominal cramping or pain. You have rapid weight loss or gain. You have shortness of breath with chest pain. You notice sudden or extreme swelling of your face, hands, ankles, feet, or legs. You have not felt your baby move in over an hour. You have severe headaches that do not go away with medicine. You have vision changes. Document Released: 10/16/2001 Document Revised: 10/27/2013 Document Reviewed: 12/23/2012 [facility] Patient Information 2015 [place], [place]. This information is not intended to replace advice given to you by your health care provider. Make sure you discuss any questions you have with your health care provider.

## 2024-05-22 ENCOUNTER — Encounter: Payer: Self-pay | Admitting: Advanced Practice Midwife

## 2024-05-22 LAB — CBC/D/PLT+RPR+RH+ABO+RUBIGG...
Antibody Screen: NEGATIVE
Basophils Absolute: 0 x10E3/uL (ref 0.0–0.2)
Basos: 0 %
EOS (ABSOLUTE): 0.5 x10E3/uL — ABNORMAL HIGH (ref 0.0–0.4)
Eos: 5 %
HCV Ab: REACTIVE — AB
HIV Screen 4th Generation wRfx: NONREACTIVE
Hematocrit: 36.5 % (ref 34.0–46.6)
Hemoglobin: 11.3 g/dL (ref 11.1–15.9)
Hepatitis B Surface Ag: NEGATIVE
Immature Grans (Abs): 0.2 x10E3/uL — ABNORMAL HIGH (ref 0.0–0.1)
Immature Granulocytes: 2 %
Lymphocytes Absolute: 1.9 x10E3/uL (ref 0.7–3.1)
Lymphs: 19 %
MCH: 25.6 pg — ABNORMAL LOW (ref 26.6–33.0)
MCHC: 31 g/dL — ABNORMAL LOW (ref 31.5–35.7)
MCV: 83 fL (ref 79–97)
Monocytes Absolute: 0.9 x10E3/uL (ref 0.1–0.9)
Monocytes: 9 %
Neutrophils Absolute: 6.6 x10E3/uL (ref 1.4–7.0)
Neutrophils: 65 %
Platelets: 233 x10E3/uL (ref 150–450)
RBC: 4.42 x10E6/uL (ref 3.77–5.28)
RDW: 13.7 % (ref 11.7–15.4)
RPR Ser Ql: NONREACTIVE
Rh Factor: POSITIVE
Rubella Antibodies, IGG: 1.2 {index} (ref >0.99)
WBC: 10.2 x10E3/uL (ref 3.4–10.8)

## 2024-05-22 LAB — URINE CULTURE

## 2024-05-22 LAB — CERVICOVAGINAL ANCILLARY ONLY
Chlamydia: NEGATIVE
Comment: NEGATIVE
Comment: NORMAL
Neisseria Gonorrhea: NEGATIVE

## 2024-05-22 LAB — HCV RT-PCR, QUANT (NON-GRAPH)
HCV log10: 6.336 {Log_IU}/mL
Hepatitis C Quantitation: 2170000 [IU]/mL

## 2024-05-22 LAB — HEMOGLOBIN A1C
Est. average glucose Bld gHb Est-mCnc: 100 mg/dL
Hgb A1c MFr Bld: 5.1 % (ref 4.8–5.6)

## 2024-05-22 LAB — HEPATITIS C ANTIBODY

## 2024-05-22 NOTE — Progress Notes (Signed)
 [facility] [place]  New Patient Visit Cheryl Frank [facility] DOB: 05-May-2001  MRN: [id number] Visit Date: 05/22/2024  Encounter Provider: Elberta Ebb Cone, NP  Subjective:   Cheryl Frank is a 23 y.o. female who presents to establish care in the practice.  Issues today include:  History of Present Illness The patient presents for anxiety and a scratchy throat.  She is currently on methadone and is seeking to resume medication for her anxiety. She has previously found Celexa  to be effective, but Prozac  did not yield the desired results. She has not tried any other medications. Her last use of Celexa  was before her previous pregnancy.  She reports a scratchy throat that began 2 to 3 days ago, accompanied by painful swallowing. She has been experiencing frequent coughing and a sensation of puffiness in her throat. She is not currently taking any allergy medications. She reports no fevers.   She is [redacted] weeks pregnant.   Review of Systems Review of Systems  Constitutional:  Negative for activity change, appetite change, chills, diaphoresis, fatigue, fever and unexpected weight change.  HENT:  Positive for congestion, postnasal drip, sinus pressure and sore throat. Negative for ear pain, tinnitus and trouble swallowing.   Eyes:  Negative for photophobia, redness and visual disturbance.  Respiratory:  Negative for cough, chest tightness, shortness of breath and wheezing.   Cardiovascular:  Negative for chest pain, palpitations and leg swelling.  Gastrointestinal:  Negative for abdominal pain, constipation, diarrhea, nausea and vomiting.  Genitourinary:  Negative for dysuria, flank pain, frequency, hematuria, pelvic pain, urgency, vaginal bleeding and vaginal discharge.  Musculoskeletal:  Negative for arthralgias, back pain, joint swelling, myalgias and neck pain.  Skin:  Negative for pallor, rash and wound.  Neurological:  Negative for dizziness, tremors, syncope, weakness,  light-headedness, numbness and headaches.  Psychiatric/Behavioral:  Negative for agitation, behavioral problems, confusion, decreased concentration, dysphoric mood, hallucinations, self-injury, sleep disturbance and suicidal ideas. The patient is nervous/anxious. The patient is not hyperactive.      Patient Active Problem List   Diagnosis Date Noted   . Threatened preterm labor 05/25/2021    Resolved Problems  No resolved problems to display.    Medical History[1]  Surgical History[2]    Allergies[3]    Social History   Social History Narrative  . Not on file    Family History[4]    Objective:  BP 123/74   Pulse 108   Temp 98.3 F (36.8 C)   Resp 18   Ht 1.607 m (5' 3.25)   Wt 76.9 kg (169 lb 9.6 oz)   SpO2 98%   Breastfeeding Unknown   BMI 29.81 kg/m   Physical Exam Physical Exam Vitals and nursing note reviewed.  Constitutional:      General: She is not in acute distress.    Appearance: Normal appearance. She is normal weight. She is not ill-appearing, toxic-appearing or diaphoretic.  HENT:     Head: Normocephalic and atraumatic.     Right Ear: Tympanic membrane, ear canal and external ear normal.     Left Ear: Tympanic membrane, ear canal and external ear normal.     Nose: Congestion present.     Mouth/Throat:     Mouth: Mucous membranes are moist.     Pharynx: Oropharynx is clear. Postnasal drip present.   Eyes:     Extraocular Movements: Extraocular movements intact.     Conjunctiva/sclera: Conjunctivae normal.     Pupils: Pupils are equal, round, and reactive to light.  Neck:     Vascular: No carotid bruit.   Cardiovascular:     Rate and Rhythm: Normal rate and regular rhythm.     Pulses: Normal pulses.     Heart sounds: Normal heart sounds.  Pulmonary:     Effort: Pulmonary effort is normal.     Breath sounds: Normal breath sounds.   Musculoskeletal:        General: Normal range of motion.     Cervical back: Normal range of motion  and neck supple. No rigidity or tenderness.     Right lower leg: No edema.     Left lower leg: No edema.  Lymphadenopathy:     Cervical: No cervical adenopathy.   Skin:    General: Skin is warm and dry.     Capillary Refill: Capillary refill takes less than 2 seconds.   Neurological:     General: No focal deficit present.     Mental Status: She is alert and oriented to person, place, and time. Mental status is at baseline.   Psychiatric:        Attention and Perception: Perception normal. She is inattentive.        Mood and Affect: Affect normal. Mood is anxious.        Speech: Speech normal.        Behavior: Behavior normal. Behavior is cooperative.        Thought Content: Thought content normal.        Cognition and Memory: Cognition and memory normal.        Judgment: Judgment normal.      Labs: No results found for this or any previous visit (from the past week).  Assessment/Plan:   1. Anxiety (Primary) - Current use of methadone limits the use of Celexa  due to potential interactions. - Initiating a low dose of Zoloft  25 mg to manage anxiety. - If the initial dose is effective but further relief is needed, the dosage can be gradually increased. - Advised to keep the provider informed about any changes in symptoms or medication tolerance. - sertraline  (ZOLOFT ) 25 mg tablet; Take 1 tablet (25 mg total) by mouth daily.  Dispense: 90 tablet; Refill: 3  2. Post-nasal drip - Symptoms may be indicative of postnasal drip, possibly due to allergies; no significant bacterial infection observed. - Advised to use Zyrtec nightly and Flonase  nasal spray for a few days to alleviate symptoms. - Can use nasal saline spray as needed throughout the day. - If there is no improvement in symptoms, she should inform the provider. - fluticasone  propionate (FLONASE ) 50 mcg/spray nasal spray; Administer 1 spray into each nostril daily.  Dispense: 16 g; Refill: 0 - cetirizine (ZyrTEC) 10 mg  tablet; Take 1 tablet (10 mg total) by mouth daily.  Dispense: 90 tablet; Refill: 3    Return in about 4 weeks (around 06/19/2024) for med check.   I have personally spent 30 minutes involved in face-to-face and non-face-to-face activities for this patient on the day of the visit.  Professional time spent includes the following activities, in addition to those noted in the documentation: Review of outside and prior records including labs, imaging, office notes, counseling of patient regarding nonpharmacologic and pharmacologic management of current conditions, diagnostic testing, differential diagnosis, goals of therapy and documentation.  Electronically signed by: Elberta Ebb Cone, NP 05/22/2024 4:50 PM        [1] History reviewed. No pertinent past medical history. [2] History reviewed. No pertinent surgical history. [3] Allergies  Allergen Reactions  . Penicillins Anaphylaxis, Hives, Rash and Swelling    Throat swells  . Penicillin Rash  [4] No family history on file.

## 2024-05-24 ENCOUNTER — Ambulatory Visit: Payer: Self-pay | Admitting: Advanced Practice Midwife

## 2024-05-24 ENCOUNTER — Encounter: Payer: Self-pay | Admitting: Advanced Practice Midwife

## 2024-05-24 DIAGNOSIS — B1920 Unspecified viral hepatitis C without hepatic coma: Secondary | ICD-10-CM | POA: Insufficient documentation

## 2024-05-24 DIAGNOSIS — Z8619 Personal history of other infectious and parasitic diseases: Secondary | ICD-10-CM | POA: Insufficient documentation

## 2024-06-04 ENCOUNTER — Other Ambulatory Visit: Payer: MEDICAID

## 2024-06-04 ENCOUNTER — Ambulatory Visit: Payer: MEDICAID | Admitting: Advanced Practice Midwife

## 2024-06-04 ENCOUNTER — Encounter: Payer: Self-pay | Admitting: Advanced Practice Midwife

## 2024-06-04 ENCOUNTER — Other Ambulatory Visit: Payer: Self-pay | Admitting: Obstetrics and Gynecology

## 2024-06-04 VITALS — BP 110/72 | HR 101 | Wt 170.0 lb

## 2024-06-04 DIAGNOSIS — O0933 Supervision of pregnancy with insufficient antenatal care, third trimester: Secondary | ICD-10-CM

## 2024-06-04 DIAGNOSIS — Z3A34 34 weeks gestation of pregnancy: Secondary | ICD-10-CM

## 2024-06-04 DIAGNOSIS — F418 Other specified anxiety disorders: Secondary | ICD-10-CM | POA: Diagnosis not present

## 2024-06-04 DIAGNOSIS — J4530 Mild persistent asthma, uncomplicated: Secondary | ICD-10-CM

## 2024-06-04 DIAGNOSIS — O0993 Supervision of high risk pregnancy, unspecified, third trimester: Secondary | ICD-10-CM | POA: Diagnosis not present

## 2024-06-04 DIAGNOSIS — F1911 Other psychoactive substance abuse, in remission: Secondary | ICD-10-CM

## 2024-06-04 DIAGNOSIS — B1920 Unspecified viral hepatitis C without hepatic coma: Secondary | ICD-10-CM

## 2024-06-04 MED ORDER — FLOVENT DISKUS 50 MCG/ACT IN AEPB: INHALATION_SPRAY | RESPIRATORY_TRACT | 12 refills | Status: AC

## 2024-06-04 MED ORDER — PROAIR HFA 108 (90 BASE) MCG/ACT IN AERS: 2.0000 | INHALATION_SPRAY | Freq: Four times a day (QID) | RESPIRATORY_TRACT | 12 refills | Status: DC | PRN

## 2024-06-04 MED ORDER — ONDANSETRON 4 MG PO TBDP: 4.0000 mg | ORAL_TABLET | Freq: Three times a day (TID) | ORAL | 0 refills | Status: AC | PRN

## 2024-06-04 NOTE — Progress Notes (Signed)
 HIGH-RISK PREGNANCY VISIT Patient name: Cheryl Frank MRN [id number]  Date of birth: [birth year] Chief Complaint:   Routine Prenatal Visit  History of Present Illness:   Cheryl Frank is a 23 y.o. [phone] female at [redacted]w[redacted]d with an Estimated Date of Delivery: 07/12/24 being seen today for ongoing management of a high-risk pregnancy complicated by methadone use (stable).  Newly dx Hep C  Today she reports feeling dizzy w/GTT. Contractions: Not present. Vag. Bleeding: None.  Movement: Present. denies leaking of fluid.      05/20/2024    3:51 PM 01/04/2021   12:02 PM 10/31/2020    1:06 PM 07/13/2020    4:28 PM 12/30/2017    5:04 PM  Depression screen PHQ 2/9  Decreased Interest 1 1 1 1  0  Down, Depressed, Hopeless 1 2 1 2  0  PHQ - 2 Score 2 3 2 3  0  Altered sleeping 3 1 0 1   Tired, decreased energy 3 3 1 2    Change in appetite 1 1 1 2    Feeling bad or failure about yourself  0 0 1 1   Trouble concentrating 1 1 1 3    Moving slowly or fidgety/restless 0 1 1 1    Suicidal thoughts 0 0 0 0   PHQ-9 Score 10 10 7 13    Difficult doing work/chores   Somewhat difficult          05/20/2024    3:51 PM 01/04/2021   12:03 PM 10/31/2020    1:08 PM 07/13/2020    4:28 PM  GAD 7 : Generalized Anxiety Score  Nervous, Anxious, on Edge 3 1 1 3   Control/stop worrying 2 1 1 3   Worry too much - different things 3 1 1 3   Trouble relaxing 2 1 1 3   Restless 2 2 1 3   Easily annoyed or irritable 3 1 1 3   Afraid - awful might happen 0 1 1 2   Total GAD 7 Score 15 8 7 20   Anxiety Difficulty   Somewhat difficult      Review of Systems:   Pertinent items are noted in HPI Denies abnormal vaginal discharge w/ itching/odor/irritation, headaches, visual changes, shortness of breath, chest pain, abdominal pain, severe nausea/vomiting, or problems with urination or bowel movements unless otherwise stated above. Pertinent History Reviewed:  Reviewed past medical,surgical, social, obstetrical and family history.   Reviewed problem list, medications and allergies. Physical Assessment:   Vitals:   06/04/24 0929  BP: 110/72  Pulse: (!) 101  Weight: 170 lb (77.1 kg)  Body mass index is 31.09 kg/m.           Physical Examination:   General appearance: alert, well appearing, and in no distress  Mental status: alert, oriented to person, place, and time  Skin: warm & dry   Extremities: Edema: None    Cardiovascular: normal heart rate noted  Respiratory: normal respiratory effort, no distress  Abdomen: gravid, soft, non-tender  Pelvic: Cervical exam deferred         Chaperone: N/A    Fetal Status:   Fundal Height: 34 cm Movement: Present    Fetal Surveillance Testing today: doppler     No results found for this or any previous visit (from the past 24 hours).  Assessment & Plan:  High-risk pregnancy: [name] at [redacted]w[redacted]d with an Estimated Date of Delivery: 07/12/24   1. [redacted] weeks gestation of pregnancy (Primary)   2. Supervision of other high risk pregnancy, antepartum   3.  Depression with anxiety On zoloft   4. Hepatitis C test positive Pt not aware, hasn't been able to get on mychart.  Helped her set up.  GI referral after delivery  5. History of drug abuse in remission (HCC) Continue methadone per methadone clinic.  NAS consult ordered    Meds:  Meds ordered this encounter  Medications   DISCONTD: PROAIR  HFA 108 (90 Base) MCG/ACT inhaler    Sig: Inhale 2 puffs into the lungs every 6 (six) hours as needed.    Dispense:  9 g    Refill:  12   Fluticasone  Propionate, Inhal, (FLOVENT  DISKUS) 50 MCG/ACT AEPB    Sig: Inhale 1 puff into lungs BID    Dispense:  1 each    Refill:  12   ondansetron  (ZOFRAN -ODT) 4 MG disintegrating tablet    Sig: Take 1 tablet (4 mg total) by mouth every 8 (eight) hours as needed for nausea or vomiting.    Dispense:  20 tablet    Refill:  0    Orders:  Orders Placed This Encounter  Procedures   Amb referral to Neonatal Abstinence Syndrome (NAS)  Team     Labs/procedures today: PN2, CMP   Reviewed: Preterm labor symptoms and general obstetric precautions including but not limited to vaginal bleeding, contractions, leaking of fluid and fetal movement were reviewed in detail with the patient.  All questions were answered. Does have home bp cuff. Office bp cuff given: not applicable. Check bp weekly, let us  know if consistently >140 and/or >90.  Follow-up: Return for needs anatomy US  asap and 2 weeks for HROB, will come back later for depo injection.   Future Appointments  Date Time Provider Department Center  06/19/2024 10:30 AM [facility]  [facility] None  06/19/2024 11:30 AM Delores Nidia CROME, FNP [facility]    Orders Placed This Encounter  Procedures   Amb referral to Neonatal Abstinence Syndrome (NAS) Team   Cathlean Ely , DNP, CNM  Medical Group 06/04/2024 4:02 PM

## 2024-06-05 LAB — COMPREHENSIVE METABOLIC PANEL WITH GFR
ALT: 30 IU/L (ref 0–32)
AST: 17 IU/L (ref 0–40)
Albumin: 3.4 g/dL — ABNORMAL LOW (ref 4.0–5.0)
Alkaline Phosphatase: 182 IU/L — ABNORMAL HIGH (ref 44–121)
BUN/Creatinine Ratio: 12 (ref 9–23)
BUN: 6 mg/dL (ref 6–20)
Bilirubin Total: 0.2 mg/dL (ref 0.0–1.2)
CO2: 19 mmol/L — ABNORMAL LOW (ref 20–29)
Calcium: 8.4 mg/dL — ABNORMAL LOW (ref 8.7–10.2)
Chloride: 102 mmol/L (ref 96–106)
Creatinine, Ser: 0.49 mg/dL — ABNORMAL LOW (ref 0.57–1.00)
Globulin, Total: 2.5 g/dL (ref 1.5–4.5)
Glucose: 107 mg/dL — ABNORMAL HIGH (ref 70–99)
Potassium: 3.9 mmol/L (ref 3.5–5.2)
Sodium: 137 mmol/L (ref 134–144)
Total Protein: 5.9 g/dL — ABNORMAL LOW (ref 6.0–8.5)
eGFR: 134 mL/min/1.73 (ref >59)

## 2024-06-06 LAB — RPR: RPR Ser Ql: NONREACTIVE

## 2024-06-06 LAB — GLUCOSE TOLERANCE, 2 HOURS W/ 1HR
Glucose, 1 hour: 103 mg/dL (ref 70–179)
Glucose, Fasting: 76 mg/dL (ref 70–91)

## 2024-06-06 LAB — CBC
Hematocrit: 34.9 % (ref 34.0–46.6)
Hemoglobin: 10.8 g/dL — ABNORMAL LOW (ref 11.1–15.9)
MCH: 25.7 pg — ABNORMAL LOW (ref 26.6–33.0)
MCHC: 30.9 g/dL — ABNORMAL LOW (ref 31.5–35.7)
MCV: 83 fL (ref 79–97)
Platelets: 211 x10E3/uL (ref 150–450)
RBC: 4.21 x10E6/uL (ref 3.77–5.28)
RDW: 14 % (ref 11.7–15.4)
WBC: 8.3 x10E3/uL (ref 3.4–10.8)

## 2024-06-06 LAB — HIV ANTIBODY (ROUTINE TESTING W REFLEX): HIV Screen 4th Generation wRfx: NONREACTIVE

## 2024-06-06 LAB — ANTIBODY SCREEN: Antibody Screen: NEGATIVE

## 2024-06-08 ENCOUNTER — Ambulatory Visit: Payer: Self-pay | Admitting: Advanced Practice Midwife

## 2024-06-17 ENCOUNTER — Encounter: Payer: MEDICAID | Admitting: Advanced Practice Midwife

## 2024-06-19 ENCOUNTER — Encounter: Payer: Self-pay | Admitting: Obstetrics and Gynecology

## 2024-06-19 ENCOUNTER — Ambulatory Visit: Payer: MEDICAID

## 2024-06-19 ENCOUNTER — Other Ambulatory Visit (HOSPITAL_COMMUNITY)
Admission: RE | Admit: 2024-06-19 | Discharge: 2024-06-19 | Disposition: A | Discharge status: Home / Self Care | Payer: MEDICAID | Source: Ambulatory Visit | Attending: Obstetrics and Gynecology | Admitting: Obstetrics and Gynecology

## 2024-06-19 ENCOUNTER — Ambulatory Visit (INDEPENDENT_AMBULATORY_CARE_PROVIDER_SITE_OTHER): Payer: MEDICAID | Admitting: Obstetrics and Gynecology

## 2024-06-19 VITALS — BP 104/68 | HR 94 | Wt 172.0 lb

## 2024-06-19 DIAGNOSIS — Z363 Encounter for antenatal screening for malformations: Secondary | ICD-10-CM

## 2024-06-19 DIAGNOSIS — Z3480 Encounter for supervision of other normal pregnancy, unspecified trimester: Secondary | ICD-10-CM

## 2024-06-19 DIAGNOSIS — F1911 Other psychoactive substance abuse, in remission: Secondary | ICD-10-CM | POA: Diagnosis not present

## 2024-06-19 DIAGNOSIS — Z124 Encounter for screening for malignant neoplasm of cervix: Secondary | ICD-10-CM | POA: Diagnosis present

## 2024-06-19 DIAGNOSIS — Z3A36 36 weeks gestation of pregnancy: Secondary | ICD-10-CM | POA: Insufficient documentation

## 2024-06-19 DIAGNOSIS — B1920 Unspecified viral hepatitis C without hepatic coma: Secondary | ICD-10-CM | POA: Diagnosis not present

## 2024-06-19 DIAGNOSIS — Z3483 Encounter for supervision of other normal pregnancy, third trimester: Secondary | ICD-10-CM

## 2024-06-19 NOTE — Patient Instructions (Signed)

## 2024-06-19 NOTE — Progress Notes (Addendum)
 US  36+5 wks,cephalic,posterior placenta gr 3,AFI 16 cm,normal right ovary,left ovary not visualized, FHR 131 bpm,EFW 3084 g 62%,HC 6%,FL 8%,AC 97%,limited view of right arm because of fetal position

## 2024-06-19 NOTE — Progress Notes (Signed)
   PRENATAL VISIT NOTE  Subjective:  Cheryl Frank is a 23 y.o. [name] at [redacted]w[redacted]d being seen today for ongoing prenatal care.  She is currently monitored for the following issues for this high-risk pregnancy and has Migraine; Depression with anxiety; Mild asthma; History of preterm delivery; Supervision of other normal pregnancy, antepartum; Hepatitis C test positive; History of gonorrhea; History of drug abuse in remission [facility]); and Late prenatal care affecting pregnancy in third trimester on their problem list.  Patient reports no complaints.  Contractions: Not present.  .  Movement: Present. Denies leaking of fluid.   The following portions of the patient's history were reviewed and updated as appropriate: allergies, current medications, past family history, past medical history, past social history, past surgical history and problem list.   Objective:    Vitals:   06/19/24 1135  BP: 104/68  Pulse: 94  Weight: 172 lb (78 kg)    Fetal Status:      Movement: Present    General: Alert, oriented and cooperative. Patient is in no acute distress.  Skin: Skin is warm and dry. No rash noted.   Cardiovascular: Normal heart rate noted  Respiratory: Normal respiratory effort, no problems with respiration noted  Abdomen: Soft, gravid, appropriate for gestational age.  Pain/Pressure: Absent     Pelvic: Cervical exam deferred        Extremities: Normal range of motion.  Edema: None  Mental Status: Normal mood and affect. Normal behavior. Normal judgment and thought content.   Fetal surveillance: US  36+5 wks,cephalic,posterior placenta gr 3,AFI 16 cm,normal right ovary,left ovary not visualized, FHR 131 bpm,EFW 3084 g 62%,HC 6%,FL 8%,AC 97%,limited view of right arm because of fetal position   Assessment and Plan:  Pregnancy: [name] at [redacted]w[redacted]d 1. Supervision of other normal pregnancy, antepartum (Primary) BP and FHR normal Doing well, feeling regular movement    2. [redacted] weeks gestation of  pregnancy Swabs today Peds list given Planning to bottle feed - Strep Gp B NAA+Rflx - Cervicovaginal ancillary only( [place])  3. Pap smear for cervical cancer screening  - Cytology - PAP( [place])  4. Hepatitis C test positive Plan to see GI after delivery   5. 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 Please refer to After Visit Summary for other counseling recommendations.   Return in about 1 week (around 06/26/2024).   Cheryl Daring, FNP

## 2024-06-21 LAB — STREP GP B NAA+RFLX: Strep Gp B NAA+Rflx: NEGATIVE

## 2024-06-22 ENCOUNTER — Ambulatory Visit: Payer: Self-pay | Admitting: Obstetrics and Gynecology

## 2024-06-23 LAB — CYTOLOGY - PAP: Diagnosis: NEGATIVE

## 2024-06-23 LAB — CERVICOVAGINAL ANCILLARY ONLY
Chlamydia: NEGATIVE
Comment: NEGATIVE
Comment: NORMAL
Neisseria Gonorrhea: NEGATIVE

## 2024-06-25 ENCOUNTER — Encounter: Payer: MEDICAID | Admitting: Advanced Practice Midwife

## 2024-06-29 ENCOUNTER — Inpatient Hospital Stay (HOSPITAL_COMMUNITY)
Admission: AD | Admit: 2024-06-29 | Discharge: 2024-07-01 | DRG: 832 | Disposition: A | Discharge status: Home / Self Care | Payer: MEDICAID | Attending: Obstetrics and Gynecology | Admitting: Obstetrics and Gynecology

## 2024-06-29 ENCOUNTER — Other Ambulatory Visit: Payer: Self-pay

## 2024-06-29 ENCOUNTER — Encounter (HOSPITAL_COMMUNITY): Payer: Self-pay

## 2024-06-29 ENCOUNTER — Emergency Department (HOSPITAL_COMMUNITY)
Admission: EM | Admit: 2024-06-29 | Discharge: 2024-06-29 | Payer: MEDICAID | Attending: Emergency Medicine | Admitting: Emergency Medicine

## 2024-06-29 ENCOUNTER — Encounter (HOSPITAL_COMMUNITY): Payer: Self-pay | Admitting: Obstetrics and Gynecology

## 2024-06-29 DIAGNOSIS — F1120 Opioid dependence, uncomplicated: Secondary | ICD-10-CM

## 2024-06-29 DIAGNOSIS — Z7951 Long term (current) use of inhaled steroids: Secondary | ICD-10-CM | POA: Diagnosis not present

## 2024-06-29 DIAGNOSIS — O99324 Drug use complicating childbirth: Secondary | ICD-10-CM | POA: Diagnosis not present

## 2024-06-29 DIAGNOSIS — Z3A38 38 weeks gestation of pregnancy: Secondary | ICD-10-CM | POA: Diagnosis not present

## 2024-06-29 DIAGNOSIS — Z79899 Other long term (current) drug therapy: Secondary | ICD-10-CM | POA: Diagnosis not present

## 2024-06-29 DIAGNOSIS — O9842 Viral hepatitis complicating childbirth: Secondary | ICD-10-CM | POA: Diagnosis not present

## 2024-06-29 DIAGNOSIS — Z87891 Personal history of nicotine dependence: Secondary | ICD-10-CM | POA: Diagnosis not present

## 2024-06-29 DIAGNOSIS — O3673X0 Maternal care for viable fetus in abdominal pregnancy, third trimester, not applicable or unspecified: Secondary | ICD-10-CM

## 2024-06-29 DIAGNOSIS — F1121 Opioid dependence, in remission: Secondary | ICD-10-CM | POA: Diagnosis present

## 2024-06-29 DIAGNOSIS — O99325 Drug use complicating the puerperium: Secondary | ICD-10-CM | POA: Diagnosis present

## 2024-06-29 DIAGNOSIS — O99344 Other mental disorders complicating childbirth: Secondary | ICD-10-CM | POA: Diagnosis not present

## 2024-06-29 DIAGNOSIS — O0933 Supervision of pregnancy with insufficient antenatal care, third trimester: Secondary | ICD-10-CM | POA: Diagnosis not present

## 2024-06-29 DIAGNOSIS — O3673X1 Maternal care for viable fetus in abdominal pregnancy, third trimester, fetus 1: Secondary | ICD-10-CM | POA: Insufficient documentation

## 2024-06-29 LAB — CBC
HCT: 30.9 % — ABNORMAL LOW (ref 36.0–46.0)
Hemoglobin: 9.7 g/dL — ABNORMAL LOW (ref 12.0–15.0)
MCH: 25 pg — ABNORMAL LOW (ref 26.0–34.0)
MCHC: 31.4 g/dL (ref 30.0–36.0)
MCV: 79.6 fL — ABNORMAL LOW (ref 80.0–100.0)
Platelets: 222 K/uL (ref 150–400)
RBC: 3.88 MIL/uL (ref 3.87–5.11)
RDW: 13.9 % (ref 11.5–15.5)
WBC: 12.8 K/uL — ABNORMAL HIGH (ref 4.0–10.5)
nRBC: 0 % (ref 0.0–0.2)

## 2024-06-29 LAB — COMPREHENSIVE METABOLIC PANEL WITH GFR
ALT: 33 U/L (ref 0–44)
AST: 22 U/L (ref 15–41)
Albumin: 1.8 g/dL — ABNORMAL LOW (ref 3.5–5.0)
Alkaline Phosphatase: 161 U/L — ABNORMAL HIGH (ref 38–126)
Anion gap: 5 (ref 5–15)
BUN: 9 mg/dL (ref 6–20)
CO2: 25 mmol/L (ref 22–32)
Calcium: 8.9 mg/dL (ref 8.9–10.3)
Chloride: 105 mmol/L (ref 98–111)
Creatinine, Ser: 0.79 mg/dL (ref 0.44–1.00)
GFR, Estimated: >60 mL/min (ref >60)
Glucose, Bld: 89 mg/dL (ref 70–99)
Potassium: 4.2 mmol/L (ref 3.5–5.1)
Sodium: 135 mmol/L (ref 135–145)
Total Bilirubin: 0.4 mg/dL (ref 0.0–1.2)
Total Protein: 5.5 g/dL — ABNORMAL LOW (ref 6.5–8.1)

## 2024-06-29 MED ORDER — MEDROXYPROGESTERONE ACETATE 150 MG/ML IM SUSP: 150.0000 mg | INTRAMUSCULAR | Status: DC | PRN

## 2024-06-29 MED ORDER — SIMETHICONE 80 MG PO CHEW
80.0000 mg | CHEWABLE_TABLET | ORAL | Status: DC | PRN
Start: 2024-06-29 — End: 2024-07-02

## 2024-06-29 MED ORDER — LIDOCAINE HCL 1 % IJ SOLN
30.0000 mL | Freq: Once | INTRAMUSCULAR | Status: AC
  Administered 2024-06-29: 30 mL

## 2024-06-29 MED ORDER — LIDOCAINE HCL (PF) 1 % IJ SOLN
INTRAMUSCULAR | Status: AC
  Filled 2024-06-29: qty 30

## 2024-06-29 MED ORDER — PRENATAL MULTIVITAMIN CH
1.0000 | ORAL_TABLET | Freq: Every day | ORAL | Status: DC
Start: 2024-06-29 — End: 2024-07-02
  Administered 2024-06-29 – 2024-07-01 (×3): 1 via ORAL
  Filled 2024-06-29 (×3): qty 1

## 2024-06-29 MED ORDER — SENNOSIDES-DOCUSATE SODIUM 8.6-50 MG PO TABS
2.0000 | ORAL_TABLET | ORAL | Status: DC
Start: 2024-06-29 — End: 2024-07-02
  Administered 2024-06-29 – 2024-07-01 (×3): 2 via ORAL
  Filled 2024-06-29 (×3): qty 2

## 2024-06-29 MED ORDER — MELATONIN 5 MG PO TABS
10.0000 mg | ORAL_TABLET | Freq: Every day | ORAL | Status: DC
  Administered 2024-06-29 – 2024-06-30 (×2): 10 mg via ORAL
  Filled 2024-06-29 (×3): qty 2

## 2024-06-29 MED ORDER — ONDANSETRON HCL 4 MG/2ML IJ SOLN: 4.0000 mg | INTRAMUSCULAR | Status: DC | PRN

## 2024-06-29 MED ORDER — ONDANSETRON HCL 4 MG PO TABS
4.0000 mg | ORAL_TABLET | ORAL | Status: DC | PRN
Start: 2024-06-29 — End: 2024-07-02

## 2024-06-29 MED ORDER — IBUPROFEN 800 MG PO TABS
800.0000 mg | ORAL_TABLET | Freq: Three times a day (TID) | ORAL | Status: DC
  Administered 2024-06-29 – 2024-07-01 (×7): 800 mg via ORAL
  Filled 2024-06-29 (×6): qty 1

## 2024-06-29 MED ORDER — ACETAMINOPHEN 500 MG PO TABS
1000.0000 mg | ORAL_TABLET | Freq: Four times a day (QID) | ORAL | Status: DC
  Administered 2024-06-29 – 2024-07-01 (×7): 1000 mg via ORAL
  Filled 2024-06-29 (×7): qty 2

## 2024-06-29 MED ORDER — SODIUM CHLORIDE 0.9% FLUSH: 3.0000 mL | INTRAVENOUS | Status: DC | PRN

## 2024-06-29 MED ORDER — KETOROLAC TROMETHAMINE 30 MG/ML IJ SOLN
30.0000 mg | Freq: Once | INTRAMUSCULAR | Status: AC
  Administered 2024-06-29: 30 mg via INTRAVENOUS

## 2024-06-29 MED ORDER — DIBUCAINE (PERIANAL) 1 % EX OINT: 1.0000 | TOPICAL_OINTMENT | CUTANEOUS | Status: DC | PRN

## 2024-06-29 MED ORDER — MELATONIN 5 MG PO TABS: 10.0000 mg | ORAL_TABLET | Freq: Every day | ORAL | Status: DC

## 2024-06-29 MED ORDER — DICLOFENAC SODIUM 1 % EX GEL
2.0000 g | Freq: Four times a day (QID) | CUTANEOUS | Status: DC
  Administered 2024-06-29 – 2024-07-01 (×4): 2 g via TOPICAL
  Filled 2024-06-29: qty 100

## 2024-06-29 MED ORDER — OXYTOCIN 10 UNIT/ML IJ SOLN
10.0000 [IU] | Freq: Once | INTRAMUSCULAR | Status: AC
  Administered 2024-06-29: 10 [IU] via INTRAMUSCULAR
  Filled 2024-06-29: qty 1

## 2024-06-29 MED ORDER — SODIUM CHLORIDE 0.9% FLUSH: 3.0000 mL | Freq: Two times a day (BID) | INTRAVENOUS | Status: DC

## 2024-06-29 MED ORDER — METHADONE HCL 10 MG PO TABS
75.0000 mg | ORAL_TABLET | Freq: Every day | ORAL | Status: DC
  Filled 2024-06-29: qty 8

## 2024-06-29 MED ORDER — LIDOCAINE 5 % EX PTCH
1.0000 | MEDICATED_PATCH | CUTANEOUS | Status: DC
  Administered 2024-06-29 – 2024-06-30 (×3): 1 via TRANSDERMAL
  Filled 2024-06-29 (×4): qty 1

## 2024-06-29 MED ORDER — SODIUM CHLORIDE 0.9 % IV SOLN: 250.0000 mL | INTRAVENOUS | Status: DC | PRN

## 2024-06-29 MED ORDER — SERTRALINE HCL 25 MG PO TABS
25.0000 mg | ORAL_TABLET | Freq: Every day | ORAL | Status: DC
  Administered 2024-06-29 – 2024-07-01 (×3): 25 mg via ORAL
  Filled 2024-06-29 (×4): qty 1

## 2024-06-29 MED ORDER — METHADONE HCL 10 MG PO TABS
70.0000 mg | ORAL_TABLET | Freq: Every day | ORAL | Status: DC
  Administered 2024-06-29 – 2024-06-30 (×2): 70 mg via ORAL
  Filled 2024-06-29 (×3): qty 7

## 2024-06-29 MED ORDER — MELATONIN 5 MG PO TABS: 20.0000 mg | ORAL_TABLET | Freq: Every day | ORAL | Status: DC

## 2024-06-29 MED ORDER — WITCH HAZEL-GLYCERIN EX PADS: 1.0000 | MEDICATED_PAD | CUTANEOUS | Status: DC | PRN

## 2024-06-29 MED ORDER — COCONUT OIL OIL
1.0000 | TOPICAL_OIL | Status: DC | PRN
Start: 2024-06-29 — End: 2024-07-02

## 2024-06-29 MED ORDER — KETOROLAC TROMETHAMINE 30 MG/ML IJ SOLN
INTRAMUSCULAR | Status: AC
  Filled 2024-06-29: qty 1

## 2024-06-29 MED ORDER — FENTANYL CITRATE (PF) 100 MCG/2ML IJ SOLN
50.0000 ug | Freq: Once | INTRAMUSCULAR | Status: AC
  Administered 2024-06-29: 50 ug via INTRAVENOUS
  Filled 2024-06-29: qty 2

## 2024-06-29 MED ORDER — BENZOCAINE-MENTHOL 20-0.5 % EX AERO: 1.0000 | INHALATION_SPRAY | CUTANEOUS | Status: DC | PRN

## 2024-06-29 MED ORDER — METHADONE HCL 10 MG PO TABS
70.0000 mg | ORAL_TABLET | Freq: Every morning | ORAL | Status: DC
  Administered 2024-06-29: 70 mg via ORAL

## 2024-06-29 MED ORDER — ACETAMINOPHEN 325 MG PO TABS
650.0000 mg | ORAL_TABLET | ORAL | Status: DC | PRN
  Administered 2024-06-29 (×2): 650 mg via ORAL
  Filled 2024-06-29 (×3): qty 2

## 2024-06-29 MED ORDER — DIPHENHYDRAMINE HCL 25 MG PO CAPS: 25.0000 mg | ORAL_CAPSULE | Freq: Four times a day (QID) | ORAL | Status: DC | PRN

## 2024-06-29 MED ORDER — METHADONE HCL 10 MG PO TABS
75.0000 mg | ORAL_TABLET | Freq: Every morning | ORAL | Status: DC
  Administered 2024-06-30 – 2024-07-01 (×2): 75 mg via ORAL
  Filled 2024-06-29 (×2): qty 8

## 2024-06-29 MED ORDER — MELATONIN 5 MG PO TABS: 25.0000 mg | ORAL_TABLET | Freq: Every day | ORAL | Status: DC

## 2024-06-29 MED ORDER — TETANUS-DIPHTH-ACELL PERTUSSIS 5-2.5-18.5 LF-MCG/0.5 IM SUSY: 0.5000 mL | PREFILLED_SYRINGE | Freq: Once | INTRAMUSCULAR | Status: DC

## 2024-06-29 NOTE — ED Provider Notes (Signed)
 [facility] EMERGENCY DEPARTMENT AT [facility] Provider Note   CSN: [id number] Arrival date & time: 06/29/24  [id number]     Patient presents with: No chief complaint on file.   Cheryl Frank is a 23 y.o. female.   23 year old female that presents secondary to active labor.  Patient was approximately 37-38 weeks and G5 P4 prior to arrival.  Patient felt contractions earlier tonight so her father was given her ride down here and she delivered approximately 4 to 5 minutes prior to arrival.  On arrival the baby and placenta were on the car seat.  Patient felt pain and she felt like she was still bleeding and she was mildly tremulous.  She states that she had hep C while she was pregnant and she had recently been on antibiotics for a tooth pain/infection.  She also had prenatal care but does not think she had any other infections.        Prior to Admission medications   Medication Sig Start Date End Date Taking? Authorizing Provider  albuterol  (VENTOLIN  HFA) 108 (90 Base) MCG/ACT inhaler INHALE 2 PUFFS INTO LUNGS EVERY 6 HOURS AS NEEDED 06/04/24   Delores Nidia CROME, FNP  Fluticasone  Propionate, Inhal, (FLOVENT  DISKUS) 50 MCG/ACT AEPB Inhale 1 puff into lungs BID 06/04/24   Delores Nidia CROME, FNP  methadone  (DOLOPHINE ) 10 MG/5ML solution Take 140 mg by mouth every 6 (six) hours.    [provider]  naloxone [facility]) nasal spray 4 mg/0.1 mL  03/12/24   [provider]  ondansetron  (ZOFRAN -ODT) 4 MG disintegrating tablet Take 1 tablet (4 mg total) by mouth every 8 (eight) hours as needed for nausea or vomiting. 06/04/24   Delores Nidia CROME, FNP  Prenat w/o A-FeCbGl-DSS-FA-DHA (CITRANATAL  ASSURE) 35-1 & 300 MG tablet One tablet and one capsule daily 01/04/21   Kizzie Suzen SAUNDERS, CNM  sertraline  (ZOLOFT ) 25 MG tablet Take 25 mg by mouth daily. 05/22/24 05/22/25  [provider]    Allergies: Penicillins    Review of Systems  Updated Vital Signs BP 130/88   Pulse  72   Temp 98.3 F (36.8 C) (Oral)   Resp 20   LMP 07/15/2023   SpO2 99%   Physical Exam Vitals and nursing note reviewed.  Constitutional:      Appearance: She is well-developed.  HENT:     Head: Normocephalic and atraumatic.     Nose: No congestion or rhinorrhea.     Mouth/Throat:     Mouth: Mucous membranes are moist.  Cardiovascular:     Rate and Rhythm: Normal rate and regular rhythm.  Pulmonary:     Effort: No respiratory distress.     Breath sounds: No stridor.  Abdominal:     General: There is no distension.  Genitourinary:    Comments: Deferred to obstetrician at transferring hospital, no active hemorrhage, placenta appeared to be intact. Musculoskeletal:        General: No swelling or tenderness. Normal range of motion.     Cervical back: Normal range of motion.  Skin:    General: Skin is warm and dry.  Neurological:     General: No focal deficit present.     Mental Status: She is alert.     (all labs ordered are listed, but only abnormal results are displayed) Labs Reviewed - No data to display  EKG: None  Radiology: No results found.   .Critical Care  Performed by: Lorette Mayo, MD Authorized by: Lorette Mayo, MD  Critical care provider statement:    Critical care time (minutes):  30   Critical care was necessary to treat or prevent imminent or life-threatening deterioration of the following conditions:  Toxidrome (Precipitous labor and delivery, ongoing hemorrhage)   Critical care was time spent personally by me on the following activities:  Development of treatment plan with patient or surrogate, discussions with consultants, evaluation of patient's response to treatment, examination of patient, ordering and review of laboratory studies, ordering and review of radiographic studies, ordering and performing treatments and interventions, pulse oximetry, re-evaluation of patient's condition and review of old charts    Medications Ordered in the ED   fentaNYL  (SUBLIMAZE ) injection 50 mcg (50 mcg Intravenous Given 06/29/24 0152)  oxytocin  (PITOCIN ) injection 10 Units (10 Units Intramuscular Given 06/29/24 0153)                                    Medical Decision Making Risk Prescription drug management. Decision regarding hospitalization.  23 year old G5, P4 with a precipitous delivery prior to arriving to the ER.  I discussed with Dr. Erik who recommended 10 mg IM Pitocin  for any possible ongoing bleeding.  Excepted to the L&D unit at [place].  Vital signs are stable she is not hypotensive or tachycardic.  I had asked for a type and screen, CBC and CMP to be done but is not appear that they were.  Patient transferred without further incident.     Final diagnoses:  Delivery of viable fetus in abdominal pregnancy in third trimester, single or unspecified fetus    ED Discharge Orders     None          Kiearra Oyervides, Selinda, MD 06/29/24 [phone]

## 2024-06-29 NOTE — ED Triage Notes (Signed)
 Pt presented POV with delivery of baby in car on the way to hospital. Pt holding baby with placenta still attached to baby. More A&O x4 and stable at this time.

## 2024-06-29 NOTE — Progress Notes (Signed)
 Notified on coming nurse of pain assessment

## 2024-06-29 NOTE — ED Notes (Signed)
 [facility] is here for transport.

## 2024-06-29 NOTE — ED Notes (Signed)
 Baby bracelet # [id number] BLC

## 2024-06-29 NOTE — Progress Notes (Signed)
 Pt requesting Melatonin at this time. Pt advised melatonin is ordered for bedtime. Pt verbalized understanding.

## 2024-06-29 NOTE — Progress Notes (Signed)
 Postpartum progress note:   Patient seen on arrival to L&D. She had approx 150cc clots on pad. Perineum and vagina examined -- has a small hemostatic clitoral laceration that did not require repair. On fundal rub, approx 50cc clots expressed. Fundus deviated to maternal right. Patient encouraged to urinate and following her emptying her bladder, her fundus was firm and below umbilicus. Bleeding scant.  Total EBL 216cc  Alain Sor, MD OB Fellow, Faculty Practice [facility], Center for [facility]

## 2024-06-29 NOTE — Progress Notes (Signed)
 Patient requested melatonin . Writer called provider and provider put order in .

## 2024-06-29 NOTE — Discharge Summary (Signed)
 Postpartum Discharge Summary  Date of Service updated***     Patient Name: Cheryl Frank DOB: Aug 15, 2001 MRN: [id number]  Date of admission: 06/29/2024 Delivery date:06/29/2024 Delivering provider:   Date of discharge: 06/29/2024  Admitting diagnosis: No admission diagnoses are documented for this encounter. Intrauterine pregnancy: [redacted]w[redacted]d     Secondary diagnosis:  Principal Problem:   SVD (spontaneous vaginal delivery)  Additional problems: ***    Discharge diagnosis: Term Pregnancy Delivered                                              Post partum procedures:{Postpartum procedures:23558} Augmentation: N/A Complications: {OB Labor/Delivery Complications:20784}  Hospital course: Onset of Labor With Vaginal Delivery      23 y.o. yo [phone] at [redacted]w[redacted]d was admitted following delivery in car en route to [facility] ER. She was transferred to [facility] for postpartum recovery. Membrane Rupture Time/Date:  ,   Delivery Method:Vaginal, Spontaneous Operative Delivery:N/A Episiotomy: None Lacerations:  None Patient had a postpartum course complicated by ***.  She is ambulating, tolerating a regular diet, passing flatus, and urinating well. Patient is discharged home in stable condition on 06/29/24.  Newborn Data: Birth date:06/29/2024 Birth time:1:32 AM Gender:Female Living status:Living Apgars: ,  Weight:   Magnesium Sulfate received: {Mag received:30440022} BMZ received: No Rhophylac:N/A MMR:N/A T-DaP:*** Flu: No RSV Vaccine received: No Transfusion:{Transfusion received:30440034}  Immunizations received: Immunization History  Administered Date(s) Administered   DTaP 05/30/1999, 07/31/1999, 10/03/1999, 07/01/2000, 06/21/2004   HIB (PRP-OMP) 05/30/1999, 07/31/1999, 10/03/1999, 03/29/2000   Hepatitis B 16-Sep-1999, 04/19/1999, 10/03/1999   Hepatitis B, ADULT 12/03/1998, 04/19/1999, 10/03/1999   IPV 05/30/1999, 07/31/1999, 10/03/1999, 06/21/2004   Influenza,inj,Quad PF,6+ Mos  09/14/2016, 10/09/2018   Influenza-Unspecified 09/16/2008   MMR 03/29/2000, 06/21/2004   Pneumococcal Conjugate-13 12/21/1999, 03/29/2000   Td 05/30/1999, 06/12/2011   Tdap 06/12/2011, 09/11/2018, 05/30/2021   Varicella 03/29/2000    Physical exam  There were no vitals filed for this visit. General: {Exam; general:21111117} Lochia: {Desc; appropriate/inappropriate:30686::appropriate} Uterine Fundus: {Desc; firm/soft:30687} Incision: {Exam; incision:21111123} DVT Evaluation: {Exam; icu:7888877} Labs: Lab Results  Component Value Date   WBC 8.3 06/04/2024   HGB 10.8 (L) 06/04/2024   HCT 34.9 06/04/2024   MCV 83 06/04/2024   PLT 211 06/04/2024      Latest Ref Rng & Units 06/04/2024   10:18 AM  CMP  Glucose 70 - 99 mg/dL 892   BUN 6 - 20 mg/dL 6   Creatinine 9.42 - 8.99 mg/dL 9.50   Sodium 865 - 855 mmol/L 137   Potassium 3.5 - 5.2 mmol/L 3.9   Chloride 96 - 106 mmol/L 102   CO2 20 - 29 mmol/L 19   Calcium 8.7 - 10.2 mg/dL 8.4   Total Protein 6.0 - 8.5 g/dL 5.9   Total Bilirubin 0.0 - 1.2 mg/dL 0.2   Alkaline Phos 44 - 121 IU/L 182   AST 0 - 40 IU/L 17   ALT 0 - 32 IU/L 30    Edinburgh Score:    06/12/2021    5:00 PM  Edinburgh Postnatal Depression Scale Screening Tool  I have been able to laugh and see the funny side of things. 0  I have looked forward with enjoyment to things. 0  I have blamed myself unnecessarily when things went wrong. 3  I have been anxious or worried for no good reason. 3  I have felt scared or panicky for no good reason. 2  Things have been getting on top of me. 2  I have been so unhappy that I have had difficulty sleeping. 0  I have felt sad or miserable. 2  I have been so unhappy that I have been crying. 0  The thought of harming myself has occurred to me. 0  Edinburgh Postnatal Depression Scale Total 12      Data saved with a previous flowsheet row definition   No data recorded  After visit meds:  Allergies as of 06/29/2024        Reactions   Penicillins Anaphylaxis   Throat swells     Med Rec must be completed prior to using this [facility]***        Discharge home in stable condition Infant Feeding: {Baby feeding:23562} Infant Disposition:{CHL IP OB HOME WITH FNUYZM:76418} Discharge instruction: per After Visit Summary and Postpartum booklet. Activity: Advance as tolerated. Pelvic rest for 6 weeks.  Diet: {OB ipzu:78888878} Future Appointments:No future appointments. Follow up Visit: Message sent to FT 8/25  Please schedule this patient for a In person postpartum visit in 6 weeks with the following provider: Any provider. Additional Postpartum F/U:Postpartum Depression checkup  High risk pregnancy complicated by: OUD on methadone , MDD, Hep C positive Delivery mode:  Vaginal, Spontaneous Anticipated Birth Control:  Unsure, considering IUD   06/29/2024 Alain Sor, MD

## 2024-06-29 NOTE — H&P (Cosign Needed Addendum)
 OBSTETRIC ADMISSION HISTORY AND PHYSICAL  Cheryl Frank is a 23 y.o. female [phone] with IUP at [redacted]w[redacted]d (dated by 26 wk US , Estimated Date of Delivery: 07/12/24) presenting s/p SVD. She woke up early this AM and felt the urge to urinate. Had pressure so she proceeded to hospital. En route to [facility] ER, delivered infant and placenta. Pt transferred to [facility] as infant required further resuscitation.  She plans on formula feeding. She is uncertain about plan for birth control.  She received her prenatal [facility] at [facility]   Prenatal History/Complications:  - Opioid use disorder in remission, on Methadone  - Hep C positive - Lat to care - Hx gonorrhea  - Migraine - MDD  Past Medical History: Past Medical History:  Diagnosis Date   Anxiety    Asthma 04/08/2021   last used inhaler 06/08/21   Depression    GERD (gastroesophageal reflux disease)    Gonorrhea 01/2021    Past Surgical History: Past Surgical History:  Procedure Laterality Date   APPENDECTOMY      Obstetrical History: OB History     Gravida  4   Para  3   Term  1   Preterm  2   AB      Living  3      SAB      IAB      Ectopic      Multiple  0   Live Births  3           Social History Social History   Socioeconomic History   Marital status: Significant Other    Spouse name: Arts administrator   Number of children: 3   Years of education: Not on file   Highest education level: 9th grade  Occupational History   Not on file  Tobacco Use   Smoking status: Former    Current packs/day: 0.00    Types: Cigarettes    Quit date: 2019    Years since quitting: 6.6   Smokeless tobacco: Never  Vaping Use   Vaping status: Never Used  Substance and Sexual Activity   Alcohol use: No   Drug use: Not Currently    Types: Methamphetamines    Comment: Fentanyl    Sexual activity: Yes    Birth control/protection: None  Other Topics Concern   Not on file  Social History Narrative   Not on file    Social Drivers of Health   Financial Resource Strain: Low Risk  (05/20/2024)   Overall Financial Resource Strain (CARDIA)    Difficulty of Paying Living Expenses: Not very hard  Food Insecurity: Food Insecurity Present (05/20/2024)   Hunger Vital Sign    Worried About Running Out of Food in the Last Year: Sometimes true    Ran Out of Food in the Last Year: Sometimes true  Transportation Needs: No Transportation Needs (05/20/2024)   PRAPARE - Administrator, Civil Service (Medical): No    Lack of Transportation (Non-Medical): No  Physical Activity: Inactive (05/20/2024)   Exercise Vital Sign    Days of Exercise per Week: 0 days    Minutes of Exercise per Session: 10 min  Stress: Stress Concern Present (05/20/2024)   [employer] of Occupational Health - Occupational Stress Questionnaire    Feeling of Stress: Very much  Social Connections: Moderately Isolated (05/20/2024)   Social Connection and Isolation Panel    Frequency of Communication with Friends and Family: Three times a week    Frequency  of Social Gatherings with Friends and Family: Twice a week    Attends Religious Services: Never    Database administrator or Organizations: No    Attends Engineer, structural: Never    Marital Status: Living with partner    Family History: No family history on file.  Allergies: Allergies  Allergen Reactions   Penicillins Anaphylaxis    Throat swells    Medications Prior to Admission  Medication Sig Dispense Refill Last Dose/Taking   albuterol  (VENTOLIN  HFA) 108 (90 Base) MCG/ACT inhaler INHALE 2 PUFFS INTO LUNGS EVERY 6 HOURS AS NEEDED 9 g 12    Fluticasone  Propionate, Inhal, (FLOVENT  DISKUS) 50 MCG/ACT AEPB Inhale 1 puff into lungs BID 1 each 12    methadone  (DOLOPHINE ) 10 MG/5ML solution Take 140 mg by mouth every 6 (six) hours.      naloxone (NARCAN) nasal spray 4 mg/0.1 mL       ondansetron  (ZOFRAN -ODT) 4 MG disintegrating tablet Take 1 tablet (4 mg  total) by mouth every 8 (eight) hours as needed for nausea or vomiting. 20 tablet 0    Prenat w/o A-FeCbGl-DSS-FA-DHA (CITRANATAL  ASSURE) 35-1 & 300 MG tablet One tablet and one capsule daily 90 each 3    sertraline  (ZOLOFT ) 25 MG tablet Take 25 mg by mouth daily.        Review of Systems  All systems reviewed and negative except as stated in HPI.  Last menstrual period 07/15/2023, unknown if currently breastfeeding. General appearance: alert and cooperative Lungs: breathing comfortably on room air Heart: regular rate Abdomen: soft, non-tender; gravid Extremities: no edema of bilateral lower extremities GU: small clitoral laceration noted, hemostatic and not requiring repair   Prenatal labs: ABO, Rh: A/Positive/-- (07/16 1612) Antibody: Negative (07/31 0900) Rubella: 1.20 (07/16 1612) RPR: Non Reactive (07/31 0900)  HBsAg: Negative (07/16 1612)  HIV: Non Reactive (07/31 0900)  GBS: --Hennie (08/15 1205)  2 hr Glucola incomplete but wnl Genetic screening declined Anatomy US  wnl Last U/S: 36+5 - cephalic, EFW 3084g - 62%, AC 02%  Prenatal Transfer Tool  Maternal Diabetes: No Genetic Screening: Declined Maternal Ultrasounds/Referrals: Normal Fetal Ultrasounds or other Referrals:  None Maternal Substance Abuse:  Yes:  Type: Methadone  Significant Maternal Medications:  Meds include: Other: Zoloft , Methadone  Significant Maternal Lab Results:  Group B Strep negative Number of Prenatal Visits:Less than or equal to 3 verified prenatal visits Other Comments:  None  No results found for this or any previous visit (from the past 24 hours).  Patient Active Problem List   Diagnosis Date Noted   History of drug abuse in remission (HCC) 06/04/2024   Late prenatal care affecting pregnancy in third trimester 06/04/2024   Hepatitis C test positive 05/24/2024   History of gonorrhea 05/24/2024   Supervision of other normal pregnancy, antepartum 05/20/2024   History of preterm  delivery 01/04/2021   Mild asthma 03/04/2017   Migraine 09/14/2016   Depression with anxiety 09/14/2016    Assessment/Plan:  Cheryl Frank is a 23 y.o. [name] at [redacted]w[redacted]d here s/p SVD  #Postpartum: No repairs performed  routine PP orders  #Opioid use disorder: On Methadone  75mg  qAM, 70mg  at bedtime  Methadone  clinic is [facility]  #MDD: Cont Zoloft  25 mg daily  #Hep C positive: F/up PP w ID vs PCP   Alain Sor, MD OB Fellow, Faculty Practice [facility], Center for [facility] 06/29/2024 4:00 AM

## 2024-06-29 NOTE — Progress Notes (Signed)
 Pt requesting something else for pain. Pt rating her pain 10/10. Virginia  Smith CNM notified. See new order.

## 2024-06-30 ENCOUNTER — Encounter: Payer: Self-pay | Admitting: Family Medicine

## 2024-06-30 LAB — RPR: RPR Ser Ql: NONREACTIVE

## 2024-06-30 NOTE — Progress Notes (Signed)
 Pt. Went to take visitor to see baby in NICU, left @0903  returned @0930 

## 2024-06-30 NOTE — Lactation Note (Signed)
 This note was copied from a baby's chart.  NICU Lactation Consultation Note  Patient Name: Cheryl Frank Date: 06/30/2024 Age:25 hours  Reason for consult: Initial assessment; Other (Comment); NICU baby; Early term 37-38.6wks (maternal Hep B exposure, IUDE (methadone ))  SUBJECTIVE Spoke to [name] and she confirmed that feeding choice on admission will be formula only; baby is currently on Similac 20 calorie formula. Lactation services are completed at this time.  OBJECTIVE Infant data: Mother's Current Feeding Choice: Formula  O2 Device: CPAP FiO2 (%): 29 %  Infant feeding assessment IDFTS - Readiness: 5   Maternal data: [name] Vaginal, Spontaneous No data recorded    ASSESSMENT Infant: Feeding Status: Scheduled 9-12-3-6 Feeding method: Tube/Gavage (Bolus)  Maternal: Not pumping  INTERVENTIONS/PLAN Interventions: No data recorded Plan: Consult Status: Complete   Cheryl Frank S Cheryl Frank 06/30/2024, 11:08 AM

## 2024-06-30 NOTE — Progress Notes (Signed)
 POSTPARTUM PROGRESS NOTE  Post Partum Day 1  Subjective:  Cheryl Frank is a 23 y.o. [name] s/p SVD in car at [redacted]w[redacted]d.  She reports she is doing well. No acute events overnight. She denies any problems with ambulating, voiding or po intake. Denies nausea or vomiting.  Pain is moderately controlled.  Lochia is appropriate.  Objective: Blood pressure 119/80, pulse 63, temperature 98.3 F (36.8 C), temperature source Oral, resp. rate 18, last menstrual period 07/15/2023, SpO2 98%, unknown if currently breastfeeding.  Physical Exam:  General: alert, cooperative and no distress Chest: no respiratory distress Heart:regular rate, distal pulses intact Uterine Fundus: firm, appropriately tender DVT Evaluation: No calf swelling or tenderness Extremities: trace edema Skin: warm, dry  Recent Labs    06/29/24 0815  HGB 9.7*  HCT 30.9*    Assessment/Plan: Reeanna A Lofaro is a 23 y.o. [name] s/p SVD in car at [redacted]w[redacted]d   PPD#1 - Doing well  Routine postpartum care Contraception: unsure  Feeding: formula Dispo: Plan for discharge 8/27.   LOS: 1 day   Augustin JAYSON Slade, MD OB Fellow  06/30/2024, 4:13 AM

## 2024-06-30 NOTE — Progress Notes (Signed)
 DE Pup set up for pt. To pump. Pt. Educated

## 2024-06-30 NOTE — Clinical Social Work Maternal (Addendum)
 CLINICAL SOCIAL WORK MATERNAL/CHILD NOTE  Patient Details  Name: Cheryl Frank MRN: [id number] Date of Birth: 03/04/2001  Date:  25-Dec-2023  Clinical Social Worker Initiating Note:  Nat Quiet, [place] Date/Time: Initiated:  06/30/24/1130     Child's Name:  Cheryl Frank   Biological Parents:  Mother, Father (Mother: Canton Yearby 03/27/1999, Father: Rutha Jama Ditch)   Need for Interpreter:  None   Reason for Referral:  Current Substance Use/Substance Use During Pregnancy  , Current CPS Involvement, Behavioral Health Concerns, Parental Support of Premature Babies < 32 weeks/or Critically Ill babies   Address:  [address] [place] [address]    Phone number:  [phone] (home)     Additional phone number:   Household Members/Support Persons (HM/SP):   Household Member/Support Person 1, Household Member/Support Person 2   HM/SP Name Relationship DOB or Age  HM/SP -1 Cheryl Frank. Significant Other    HM/SP -2 Cheryl Jama Ditch Significant Other Father    HM/SP -3        HM/SP -4        HM/SP -5        HM/SP -6        HM/SP -7        HM/SP -8          Natural Supports (not living in the home):  Immediate Family   Professional Supports: Case Manager/Social Worker   Employment: Unemployed   Type of Work:     Education:  9 to 11 years   Homebound arranged: No  Financial Resources:  [employer]   Other Resources:  Sales executive  , [employer]   Cultural/Religious Considerations Which May Impact Care:    Strengths:  Ability to meet basic needs  , Home prepared for child  , Psychotropic Medications, Pediatrician chosen   Psychotropic Medications:  Zoloft      Pediatrician:    Sara Lee  Pediatrician List:   [facility] [place]    [place] ([facility] [place])  [facility]      Pediatrician Fax Number:    Risk  Factors/Current Problems:  Substance Use  , Mental Health Concerns  , DHHS Involvement     Cognitive State:  Able to Concentrate  , Alert     Mood/Affect:  Calm  , Interested  , Comfortable     CSW Assessment: CSW received consult for Comments, Substance use hx aniexty depression, Edinburgh score of 10 and NICU admission. CSW met with MOB to offer support and complete assessment.   CSW met with MOB at bedside and introduced CSW roles. MOB was calm, pleasant and engaged with CSW throughout the visit. MOB sister Cheryl Frank) was present at bedside and MOB identified her sister and FOB as her supports. CSW offered MOB privacy and MOB gave CSW permission to share all information with her sister present. MOB confirmed that her demographic information on hospital file was correct and stated that she lived with FOB (Cheryl Frank) and FOB's father. MOB clarified that FOB is not the father of her older children and will be involved with caring for their son. She reported that she is not with her previous partner and he is incarcerated for violating his probation. CSW inquired about the whereabouts of MOB's three older children. MOB reported that her three children Cheryl Frank (10/19/18), Cheryl Frank (07/23/19),  Cheryl Frank (06/11/21) were removed from her custody by [facility] CPS, and her parental rights were terminated in 2024. CSW informed MOB that CSW would have to make a CPS report due to her parental rights being terminated. MOB verbalized understanding and stated that she hoped CPS would allow her to parent her son. CSW acknowledged MOB's concerns. CSW assessed MOB's feeling surrounding her infant's NICU admission. MOB shared that she was worried about his condition and had been to the NICU where she received updates about his care. She was also educated about the NAS protocol for her infant. MOB expressed that she had planned to formula feed her infant as she had done with all her children. MOB  reported that she had chosen a pediatrician at [facility] [place]. CSW discussed NICU support services and offered to check in with MOB weekly for support while the infant remains in the NICU. MOB was receptive to the visits.  CSW acknowledged that MOB's use of maintenance assistance treatment for opioid use disorder and inquired where MOB receives treatment. 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 She reported that she has been going there for over 100 days, and her next appointment is tomorrow at 9:40 am. MOB reported that she would notify her provider Ellouise that she gave birth and would reschedule her appointment. MOB mentioned that she has been receiving her methadone while inpatient and has a prescription for Belsomra which is a medication for sleep.  CSW acknowledged and encouraged MOB's treatment efforts. CSW informed MOB about the hospital drug screen policy and that CSW would continue to follow the infant's CDS and make a report, if warranted. MOB reported understanding and denied using any other substances during the pregnancy.  CSW inquired about MOB's mental health history. MOB shared that she was diagnosed with anxiety and depression since the age of 53. She currently takes Zoloft to treat her symptoms which she feels is effective. MOB reported that she experienced anxiety during the pregnancy as evidenced by her feeling hot and panicky' for no reason. MOB shared she copes by watching television and tries to stay calm. CSW encouraged MOB's efforts. MOB shared that she experienced postpartum depression/anxiety with her older children as evidenced by her having overwhelming anxiety that was not treated. CSW provided education regarding the baby blues period vs. perinatal mood disorders, discussed treatment and gave resources for mental health follow  up if concerns arise. MOB was receptive to the resources. CSW recommended MOB complete a self-evaluation during the postpartum time period using the New Mom Checklist from Postpartum Progress and encouraged MOB to contact a medical professional if symptoms are noted at any time. MOB reported that she felt comfortable reaching out to her provider if concerns arise. CSW assessed MOB for safety. MOB denied thoughts of harm to self, others and denied domestic violence concerns.  CSW inquired if MOB had essential items to care for her infant. MOB reported that she had all essential items including a crib and car seat. MOB reported that she receives WIC/SNAP benefits and would call to add her infant to the benefits. CSW inquired if MOB had community support. MOB reported that she sees case manager Romero Finger) through WIC/DSS pregnancy program and receives home visits for ongoing support. CSW provided review of Sudden Infant Death Syndrome (SIDS) precautions. MOB verbalized understanding. CSW assessed MOB for additional needs. MOB reported none.  CSW notified that [facility] CPS was in route to the hospital from a report that was made in the community.  CSW will continue to offer support and resources to family while infant remains in NICU. CSW Will Continue to Monitor Umbilical Cord Tissue Drug Screen Results and Make Report if Warranted. CSW Plan/Description:  Psychosocial Support and Ongoing Assessment of Needs, Perinatal Mood and Anxiety Disorder (PMADs) Education, Hospital Drug Screen Policy Information, Child Protective Service Report  , CSW Will Continue to Monitor Umbilical Cord Tissue Drug Screen Results and Make Report if Ranelle Nat DELENA Bevely, LCSW 11/02/2024, 12:41 PM

## 2024-06-30 NOTE — Progress Notes (Signed)
 CSW received call from [facility] DSS CPS social worker ([place] Houch[place] [phone] ext.[phone]) who reported that they received a CPS report in regards to MOB and infant. CPS social worker verbalized a plan to come and visit with infant and MOB in the hospital.   CSW escorted [facility] DSS CPS social worker ([place]) to visit infant in the NICU. NICU RN provided medical update to CPS social worker. CSW escorted CPS social worker to meet with MOB.   Per CPS social worker MOB admitted to Fentanyl use during pregnancy, CSW updated NNP.   No barriers to [facility] visiting with infant in the NICU.  Currently there are barriers to discharge.   CPS social worker agreed to contact CSW with updates.   Suzen Law, LCSW Clinical Social Worker [facility] Cell#: [phone]

## 2024-07-01 MED ORDER — OXYCODONE HCL 5 MG PO TABS
5.0000 mg | ORAL_TABLET | Freq: Four times a day (QID) | ORAL | Status: DC | PRN
  Administered 2024-07-01: 5 mg via ORAL
  Filled 2024-07-01: qty 1

## 2024-07-01 MED ORDER — METHADONE HCL 10 MG PO TABS: ORAL_TABLET | ORAL | Status: AC

## 2024-07-02 LAB — SURGICAL PATHOLOGY

## 2024-07-11 ENCOUNTER — Telehealth (HOSPITAL_COMMUNITY): Payer: Self-pay | Admitting: *Deleted

## 2024-07-11 NOTE — Telephone Encounter (Signed)
 Attempted hospital discharge follow-up phone call. Message received stating: Voicemail is full and cannot accept new messages. Please try again later.  Allean IVAR Carton, RN, 07/11/24, [phone]

## 2024-08-05 ENCOUNTER — Ambulatory Visit: Payer: MEDICAID | Admitting: Women's Health

## 2024-09-01 ENCOUNTER — Ambulatory Visit: Payer: MEDICAID | Admitting: Women's Health
